# Patient Record
Sex: Female | Born: 1975 | Race: White | Hispanic: No | Marital: Married | Smoking: Former smoker
Health system: Southern US, Community
[De-identification: ages and names within clinical notes are randomized; demographics above are authoritative.]

## PROBLEM LIST (undated history)

## (undated) DIAGNOSIS — Z1371 Encounter for nonprocreative screening for genetic disease carrier status: Secondary | ICD-10-CM

## (undated) DIAGNOSIS — Z8041 Family history of malignant neoplasm of ovary: Secondary | ICD-10-CM

## (undated) DIAGNOSIS — F419 Anxiety disorder, unspecified: Secondary | ICD-10-CM

## (undated) DIAGNOSIS — N3010 Interstitial cystitis (chronic) without hematuria: Secondary | ICD-10-CM

## (undated) DIAGNOSIS — B977 Papillomavirus as the cause of diseases classified elsewhere: Secondary | ICD-10-CM

## (undated) DIAGNOSIS — R232 Flushing: Secondary | ICD-10-CM

## (undated) HISTORY — PX: PARTIAL HYSTERECTOMY: SHX80

## (undated) HISTORY — DX: Family history of malignant neoplasm of ovary: Z80.41

## (undated) HISTORY — DX: Encounter for nonprocreative screening for genetic disease carrier status: Z13.71

## (undated) HISTORY — DX: Papillomavirus as the cause of diseases classified elsewhere: B97.7

---

## 2005-04-17 ENCOUNTER — Ambulatory Visit: Payer: Self-pay | Admitting: Unknown Physician Specialty

## 2012-09-15 DIAGNOSIS — B977 Papillomavirus as the cause of diseases classified elsewhere: Secondary | ICD-10-CM

## 2012-09-15 HISTORY — DX: Papillomavirus as the cause of diseases classified elsewhere: B97.7

## 2015-06-14 DIAGNOSIS — F32A Depression, unspecified: Secondary | ICD-10-CM | POA: Insufficient documentation

## 2015-06-14 DIAGNOSIS — F419 Anxiety disorder, unspecified: Secondary | ICD-10-CM | POA: Insufficient documentation

## 2015-12-09 ENCOUNTER — Ambulatory Visit
Admission: EM | Admit: 2015-12-09 | Discharge: 2015-12-09 | Disposition: A | Discharge status: Home / Self Care | Payer: 59 | Attending: Family Medicine | Admitting: Family Medicine

## 2015-12-09 DIAGNOSIS — J101 Influenza due to other identified influenza virus with other respiratory manifestations: Secondary | ICD-10-CM | POA: Diagnosis not present

## 2015-12-09 HISTORY — DX: Flushing: R23.2

## 2015-12-09 HISTORY — DX: Anxiety disorder, unspecified: F41.9

## 2015-12-09 HISTORY — DX: Interstitial cystitis (chronic) without hematuria: N30.10

## 2015-12-09 LAB — RAPID INFLUENZA A&B ANTIGENS: Influenza A (ARMC): NEGATIVE

## 2015-12-09 LAB — RAPID INFLUENZA A&B ANTIGENS (ARMC ONLY): INFLUENZA B (ARMC): POSITIVE — AB

## 2015-12-09 MED ORDER — OSELTAMIVIR PHOSPHATE 75 MG PO CAPS: 75.0000 mg | ORAL_CAPSULE | Freq: Two times a day (BID) | ORAL | Status: DC

## 2015-12-09 NOTE — ED Notes (Signed)
Patient c/o weakness, body aches, coughing, and fever which all started this past Friday.

## 2015-12-09 NOTE — ED Provider Notes (Signed)
CSN: [id number]     Arrival date & time 12/09/15  0935 History   First MD Initiated Contact with Patient 12/09/15 1110     Chief Complaint  Patient presents with  . URI   (Consider location/radiation/quality/duration/timing/severity/associated sxs/prior Treatment) HPI  40 yo F with onset URI 2 days ago-- fever 101.5 yesterday treated with tylenol- last night fever and chills/ Fatigue , general malaise, mild headache,cough--- reports diagnosed with flu about a month ago -clinical presentation / Not lab confirmed.) Treated with Tamiflu. Now having similar symptoms again.Aching long bones, Has used zyrtec in past for seasonal allergies- not taking regularly  Past Medical History  Diagnosis Date  . Hot flashes   . Anxiety   . Interstitial cystitis    Past Surgical History  Procedure Laterality Date  . Partial hysterectomy     History reviewed. No pertinent family history. Social History  Substance Use Topics  . Smoking status: Never Smoker   . Smokeless tobacco: Never Used  . Alcohol Use: No   OB History    No data available     Review of Systems Review of 10 systems negative for acute change except as referenced in HPI   Allergies  Review of patient's allergies indicates no known allergies.  Home Medications   Prior to Admission medications   Medication Sig Start Date End Date Taking? Authorizing Provider  ALPRAZolam Prudy Feeler) 0.25 MG tablet Take 0.25 mg by mouth at bedtime as needed for anxiety.   Yes Historical Provider, MD  pentosan polysulfate (ELMIRON) 100 MG capsule Take 100 mg by mouth 3 (three) times daily.   Yes Historical Provider, MD  sertraline (ZOLOFT) 50 MG tablet Take 50 mg by mouth daily.   Yes Historical Provider, MD  oseltamivir (TAMIFLU) 75 MG capsule Take 1 capsule (75 mg total) by mouth every 12 (twelve) hours. 12/09/15   Rae Halsted, PA-C   Meds Ordered and Administered this Visit  Medications - No data to display  BP 107/64 mmHg  Pulse 70   Temp(Src) 99 F (37.2 C) (Oral)  Resp 16  Ht  (1.626 m)  Wt 156 lb (70.761 kg)  BMI 26.76 kg/m2  SpO2 99% No data found.   Physical Exam  General: NAD, does not appear toxic but obviously doesn't feel well. HEENT:normocephalic,atraumatic, mucous membranes moist,grossly normal hearing Eyes: EOMI, conjunctiva clear, conjugate gaze Neck: supple,no lymphadenopathy Resp : CT A, bilat; normal respiratory effort Card : RRR Abd:  Not distended Skin: no rash, skin intact MSK: no deformities, ambulatory without assistance Neuro : good attention,recall-good memory, no focal neuro deficits Psych: speech and behavior appropriate   ED Course  Procedures (including critical care time)  Labs Review Labs Reviewed  RAPID INFLUENZA A&B ANTIGENS ([facility] ONLY) - Abnormal; Notable for the following:    Influenza B ([facility]) POSITIVE (*)    All other components within normal limits    Imaging Review No results found.   MDM   1. Influenza B   Plan: Test results and diagnosis reviewed with patient Appropriate for Tamiflu Rx and wishes to proceed Rx as per orders;  benefits, risks, potential side effects reviewed   Recommend supportive treatment with cyclic tylenol and ibuprofen, rest , fluids Relative isolation until 24 hours without fever without medication Seek additional medical care if symptoms worsen or are not improving   New Prescriptions   OSELTAMIVIR (TAMIFLU) 75 MG CAPSULE    Take 1 capsule (75 mg total) by mouth every 12 (twelve) hours.  Rx sent to [facility] but notified they are out of Rx-- Printed to send copy with patient for pharmacy of choice   Rae HalstedLaurie W Matalie Romberger, PA-C 12/12/15 1713

## 2015-12-09 NOTE — Discharge Instructions (Signed)
Increase fluids--- solid food not needed...void every 3 hours...steamy shower/vaporizer- Robitussin DM 1 tsp every 4 hours for cough ane to thin mucous Alternate Tylenol and Ibuprofen  Every 4-6 hours for fever and discomfort   Influenza, Adult Influenza ("the flu") is a viral infection of the respiratory tract. It occurs more often in winter months because people spend more time in close contact with one another. Influenza can make you feel very sick. Influenza easily spreads from person to person (contagious). CAUSES  Influenza is caused by a virus that infects the respiratory tract. You can catch the virus by breathing in droplets from an infected person's cough or sneeze. You can also catch the virus by touching something that was recently contaminated with the virus and then touching your mouth, nose, or eyes. RISKS AND COMPLICATIONS You may be at risk for a more severe case of influenza if you smoke cigarettes, have diabetes, have chronic heart disease (such as heart failure) or lung disease (such as asthma), or if you have a weakened immune system. Elderly people and pregnant women are also at risk for more serious infections. The most common problem of influenza is a lung infection (pneumonia). Sometimes, this problem can require emergency medical care and may be life threatening. SIGNS AND SYMPTOMS  Symptoms typically last 4 to 10 days and may include:  Fever.  Chills.  Headache, body aches, and muscle aches.  Sore throat.  Chest discomfort and cough.  Poor appetite.  Weakness or feeling tired.  Dizziness.  Nausea or vomiting. DIAGNOSIS  Diagnosis of influenza is often made based on your history and a physical exam. A nose or throat swab test can be done to confirm the diagnosis. TREATMENT  In mild cases, influenza goes away on its own. Treatment is directed at relieving symptoms. For more severe cases, your health care provider may prescribe antiviral medicines to shorten  the sickness. Antibiotic medicines are not effective because the infection is caused by a virus, not by bacteria. HOME CARE INSTRUCTIONS  Take medicines only as directed by your health care provider.  Use a cool mist humidifier to make breathing easier.  Get plenty of rest until your temperature returns to normal. This usually takes 3 to 4 days.  Drink enough fluid to keep your urine clear or pale yellow.  Cover yourmouth and nosewhen coughing or sneezing,and wash your handswellto prevent thevirusfrom spreading.  Stay homefromwork orschool untilthe fever is gonefor at least 381full day. PREVENTION  An annual influenza vaccination (flu shot) is the best way to avoid getting influenza. An annual flu shot is now routinely recommended for all adults in the [place] SEEK MEDICAL CARE IF:  You experiencechest pain, yourcough worsens,or you producemore mucus.  Youhave nausea,vomiting, ordiarrhea.  Your fever returns or gets worse. SEEK IMMEDIATE MEDICAL CARE IF:  You havetrouble breathing, you become short of breath,or your skin ornails becomebluish.  You have severe painor stiffnessin the neck.  You develop a sudden headache, or pain in the face or ear.  You have nausea or vomiting that you cannot control. MAKE SURE YOU:   Understand these instructions.  Will watch your condition.  Will get help right away if you are not doing well or get worse.   This information is not intended to replace advice given to you by your health care provider. Make sure you discuss any questions you have with your health care provider.   Document Released: 08/29/2000 Document Revised: 09/22/2014 Document Reviewed: 12/01/2011 Elsevier Interactive Patient Education 2016  Elsevier Inc. ° °

## 2015-12-15 ENCOUNTER — Ambulatory Visit
Admission: EM | Admit: 2015-12-15 | Discharge: 2015-12-15 | Disposition: A | Discharge status: Home / Self Care | Payer: 59 | Attending: Family Medicine | Admitting: Family Medicine

## 2015-12-15 ENCOUNTER — Ambulatory Visit (INDEPENDENT_AMBULATORY_CARE_PROVIDER_SITE_OTHER): Payer: 59

## 2015-12-15 ENCOUNTER — Encounter: Payer: Self-pay | Admitting: Gynecology

## 2015-12-15 DIAGNOSIS — J111 Influenza due to unidentified influenza virus with other respiratory manifestations: Secondary | ICD-10-CM

## 2015-12-15 DIAGNOSIS — J209 Acute bronchitis, unspecified: Secondary | ICD-10-CM | POA: Diagnosis not present

## 2015-12-15 MED ORDER — ALBUTEROL SULFATE HFA 108 (90 BASE) MCG/ACT IN AERS: 2.0000 | INHALATION_SPRAY | RESPIRATORY_TRACT | Status: DC | PRN

## 2015-12-15 MED ORDER — FEXOFENADINE-PSEUDOEPHED ER 180-240 MG PO TB24: 1.0000 | ORAL_TABLET | Freq: Every day | ORAL | Status: DC

## 2015-12-15 MED ORDER — PREDNISONE 10 MG (21) PO TBPK: ORAL_TABLET | ORAL | Status: DC

## 2015-12-15 MED ORDER — AZITHROMYCIN 500 MG PO TABS: ORAL_TABLET | ORAL | Status: DC

## 2015-12-15 MED ORDER — HYDROCOD POLST-CPM POLST ER 10-8 MG/5ML PO SUER: 5.0000 mL | Freq: Two times a day (BID) | ORAL | Status: DC | PRN

## 2015-12-15 NOTE — Discharge Instructions (Signed)
Acute Bronchitis Bronchitis is when the airways that extend from the windpipe into the lungs get red, puffy, and painful (inflamed). Bronchitis often causes thick spit (mucus) to develop. This leads to a cough. A cough is the most common symptom of bronchitis. In acute bronchitis, the condition usually begins suddenly and goes away over time (usually in 2 weeks). Smoking, allergies, and asthma can make bronchitis worse. Repeated episodes of bronchitis may cause more lung problems. HOME CARE  Rest.  Drink enough fluids to keep your pee (urine) clear or pale yellow (unless you need to limit fluids as told by your doctor).  Only take over-the-counter or prescription medicines as told by your doctor.  Avoid smoking and secondhand smoke. These can make bronchitis worse. If you are a smoker, think about using nicotine gum or skin patches. Quitting smoking will help your lungs heal faster.  Reduce the chance of getting bronchitis again by:  Washing your hands often.  Avoiding people with cold symptoms.  Trying not to touch your hands to your mouth, nose, or eyes.  Follow up with your doctor as told. GET HELP IF: Your symptoms do not improve after 1 week of treatment. Symptoms include:  Cough.  Fever.  Coughing up thick spit.  Body aches.  Chest congestion.  Chills.  Shortness of breath.  Sore throat. GET HELP RIGHT AWAY IF:   You have an increased fever.  You have chills.  You have severe shortness of breath.  You have bloody thick spit (sputum).  You throw up (vomit) often.  You lose too much body fluid (dehydration).  You have a severe headache.  You faint. MAKE SURE YOU:   Understand these instructions.  Will watch your condition.  Will get help right away if you are not doing well or get worse.   This information is not intended to replace advice given to you by your health care provider. Make sure you discuss any questions you have with your health care  provider.   Document Released: 02/18/2008 Document Revised: 05/04/2013 Document Reviewed: 02/22/2013 Elsevier Interactive Patient Education 2016 Elsevier Inc.  Pharyngitis Pharyngitis is a sore throat (pharynx). There is redness, pain, and swelling of your throat. HOME CARE   Drink enough fluids to keep your pee (urine) clear or pale yellow.  Only take medicine as told by your doctor.  You may get sick again if you do not take medicine as told. Finish your medicines, even if you start to feel better.  Do not take aspirin.  Rest.  Rinse your mouth (gargle) with salt water ( tsp of salt per 1 qt of water) every 1-2 hours. This will help the pain.  If you are not at risk for choking, you can suck on hard candy or sore throat lozenges. GET HELP IF:  You have large, tender lumps on your neck.  You have a rash.  You cough up green, yellow-brown, or bloody spit. GET HELP RIGHT AWAY IF:   You have a stiff neck.  You drool or cannot swallow liquids.  You throw up (vomit) or are not able to keep medicine or liquids down.  You have very bad pain that does not go away with medicine.  You have problems breathing (not from a stuffy nose). MAKE SURE YOU:   Understand these instructions.  Will watch your condition.  Will get help right away if you are not doing well or get worse.   This information is not intended to replace advice given to  you by your health care provider. Make sure you discuss any questions you have with your health care provider.   Document Released: 02/18/2008 Document Revised: 06/22/2013 Document Reviewed: 05/09/2013 Elsevier Interactive Patient Education 2016 Elsevier Inc.  Upper Respiratory Infection, Adult Most upper respiratory infections (URIs) are caused by a virus. A URI affects the nose, throat, and upper air passages. The most common type of URI is often called "the common cold." HOME CARE   Take medicines only as told by your  doctor.  Gargle warm saltwater or take cough drops to comfort your throat as told by your doctor.  Use a warm mist humidifier or inhale steam from a shower to increase air moisture. This may make it easier to breathe.  Drink enough fluid to keep your pee (urine) clear or pale yellow.  Eat soups and other clear broths.  Have a healthy diet.  Rest as needed.  Go back to work when your fever is gone or your doctor says it is okay.  You may need to stay home longer to avoid giving your URI to others.  You can also wear a face mask and wash your hands often to prevent spread of the virus.  Use your inhaler more if you have asthma.  Do not use any tobacco products, including cigarettes, chewing tobacco, or electronic cigarettes. If you need help quitting, ask your doctor. GET HELP IF:  You are getting worse, not better.  Your symptoms are not helped by medicine.  You have chills.  You are getting more short of breath.  You have brown or red mucus.  You have yellow or brown discharge from your nose.  You have pain in your face, especially when you bend forward.  You have a fever.  You have puffy (swollen) neck glands.  You have pain while swallowing.  You have white areas in the back of your throat. GET HELP RIGHT AWAY IF:   You have very bad or constant:  Headache.  Ear pain.  Pain in your forehead, behind your eyes, and over your cheekbones (sinus pain).  Chest pain.  You have long-lasting (chronic) lung disease and any of the following:  Wheezing.  Long-lasting cough.  Coughing up blood.  A change in your usual mucus.  You have a stiff neck.  You have changes in your:  Vision.  Hearing.  Thinking.  Mood. MAKE SURE YOU:   Understand these instructions.  Will watch your condition.  Will get help right away if you are not doing well or get worse.   This information is not intended to replace advice given to you by your health care  provider. Make sure you discuss any questions you have with your health care provider.   Document Released: 02/18/2008 Document Revised: 01/16/2015 Document Reviewed: 12/07/2013 Elsevier Interactive Patient Education [employer] Elsevier Inc.

## 2015-12-15 NOTE — ED Notes (Signed)
Per patient was seen on 12/09/15 and dx with the FLU. Per patient now with a cough.

## 2015-12-15 NOTE — ED Provider Notes (Signed)
CSN: [id number]     Arrival date & time 12/15/15  1252 History   First MD Initiated Contact with Patient 12/15/15 1339    Nurses notes were reviewed. Chief Complaint  Patient presents with  . Cough   States that she was treated for the flu here on Sunday. Place on Tamiflu fever stopped on Wednesday but she's had a progressive cough chest discomfort and back discomfort as well. She also reports some bronchospasms as well. Of concern is she states she's was coughing even before she had the flu and she been having symptoms of upper respiratory tract cough and congestion for several weeks now. She states that the arm pain and leg pain though has gotten better as well as a fever. She does not smoke does not have been smoking around her.  Medical problems positive for anxiety, interstitial cystitis and previous partial hysterectomy. No significant family medical problems present. She does not smoke.   (Consider location/radiation/quality/duration/timing/severity/associated sxs/prior Treatment) Patient is a 40 y.o. female presenting with cough. The history is provided by the patient. No language interpreter was used.  Cough Cough characteristics:  Productive Sputum characteristics:  Green Severity:  Moderate Onset quality:  Sudden Timing:  Constant Progression:  Worsening Chronicity:  New Relieved by:  Nothing Ineffective treatments:  Cough suppressants Associated symptoms: shortness of breath   Risk factors: recent infection     Past Medical History  Diagnosis Date  . Hot flashes   . Anxiety   . Interstitial cystitis    Past Surgical History  Procedure Laterality Date  . Partial hysterectomy     No family history on file. Social History  Substance Use Topics  . Smoking status: Never Smoker   . Smokeless tobacco: Never Used  . Alcohol Use: No   OB History    Gravida Para Term Preterm AB TAB SAB Ectopic Multiple Living   2 2             Review of Systems  Respiratory:  Positive for cough and shortness of breath.   All other systems reviewed and are negative.   Allergies  Review of patient's allergies indicates no known allergies.  Home Medications   Prior to Admission medications   Medication Sig Start Date End Date Taking? Authorizing Provider  ALPRAZolam Prudy Feeler(XANAX) 0.25 MG tablet Take 0.25 mg by mouth at bedtime as needed for anxiety.   Yes Historical Provider, MD  pentosan polysulfate (ELMIRON) 100 MG capsule Take 100 mg by mouth 3 (three) times daily.   Yes Historical Provider, MD  sertraline (ZOLOFT) 50 MG tablet Take 50 mg by mouth daily.   Yes Historical Provider, MD  albuterol (PROVENTIL HFA;VENTOLIN HFA) 108 (90 Base) MCG/ACT inhaler Inhale 2 puffs into the lungs every 4 (four) hours as needed for wheezing or shortness of breath. 12/15/15   Hassan RowanEugene Lamya Lausch, MD  azithromycin (ZITHROMAX) 500 MG tablet 1 tablet daily for the next 5 days 12/15/15   Hassan RowanEugene Cariah Salatino, MD  chlorpheniramine-HYDROcodone [facility](TUSSIONEX PENNKINETIC ER) 10-8 MG/5ML SUER Take 5 mLs by mouth every 12 (twelve) hours as needed for cough. 12/15/15   Hassan RowanEugene Pavel Gadd, MD  fexofenadine-pseudoephedrine (ALLEGRA-D ALLERGY & CONGESTION) 180-240 MG 24 hr tablet Take 1 tablet by mouth daily. 12/15/15   Hassan RowanEugene Rella Egelston, MD  oseltamivir (TAMIFLU) 75 MG capsule Take 1 capsule (75 mg total) by mouth every 12 (twelve) hours. 12/09/15   Rae HalstedLaurie W Lee, PA-C   Meds Ordered and Administered this Visit  Medications - No data to display  BP  103/64 mmHg  Pulse 68  Temp(Src) 98.2 F (36.8 C) (Oral)  Resp 16  Ht  (1.626 m)  Wt 156 lb (70.761 kg)  BMI 26.76 kg/m2  SpO2 99% No data found.   Physical Exam  Constitutional: She is oriented to person, place, and time. She appears well-developed and well-nourished.  HENT:  Head: Normocephalic and atraumatic.  Eyes: Pupils are equal, round, and reactive to light.  Neck: Normal range of motion.  Cardiovascular: Normal rate and regular rhythm.   Pulmonary/Chest: Effort  normal and breath sounds normal. No respiratory distress. She has no wheezes.  Musculoskeletal: Normal range of motion.  Neurological: She is alert and oriented to person, place, and time.  Skin: Skin is warm and dry.  Vitals reviewed.   ED Course  Procedures (including critical care time)  Labs Review Labs Reviewed - No data to display  Imaging Review Dg Chest 2 View  12/15/2015  CLINICAL DATA:  40 year old female with cough for 1 week. EXAM: CHEST  2 VIEW COMPARISON:  None. FINDINGS: The cardiomediastinal silhouette is unremarkable. There is no evidence of focal airspace disease, pulmonary edema, suspicious pulmonary nodule/mass, pleural effusion, or pneumothorax. No acute bony abnormalities are identified. IMPRESSION: No active cardiopulmonary disease. Electronically Signed   By: Harmon Pier M.D.   On: 12/15/2015 14:15     Visual Acuity Review  Right Eye Distance:   Left Eye Distance:   Bilateral Distance:    Right Eye Near:   Left Eye Near:    Bilateral Near:         MDM   1. Influenzal bronchitis   2. Bronchitis, acute, with bronchospasm      She now has secondary infection from the flu. However the question is whether the secondary infection and a pneumonia or bronchitis. With a history of having difficulty with wheezing and shortness of breath will treat with albuterol inhaler strong consider 6 day course of prednisone and Tussionex 1 teaspoon twice a day for the cough. The big question be the choice of antibiotics and now dependent on chest x-ray. Chest x-ray is negative will go with Zithromax if positive we'll place on Levaquin for pneumonia.   Chest x-ray is negative will place on Zithromax for 5 days.  Note: This dictation was prepared with Dragon dictation along with smaller phrase technology. Any transcriptional errors that result from this process are unintentional.  Hassan Rowan, MD 12/15/15 1453

## 2016-01-08 ENCOUNTER — Other Ambulatory Visit: Payer: Self-pay | Admitting: Physician Assistant

## 2016-01-08 DIAGNOSIS — G43709 Chronic migraine without aura, not intractable, without status migrainosus: Secondary | ICD-10-CM

## 2016-01-25 ENCOUNTER — Ambulatory Visit
Admission: RE | Admit: 2016-01-25 | Discharge: 2016-01-25 | Disposition: A | Discharge status: Home / Self Care | Payer: 59 | Source: Ambulatory Visit | Attending: Physician Assistant | Admitting: Physician Assistant

## 2016-01-25 DIAGNOSIS — G43709 Chronic migraine without aura, not intractable, without status migrainosus: Secondary | ICD-10-CM | POA: Insufficient documentation

## 2016-01-25 MED ORDER — GADOBENATE DIMEGLUMINE 529 MG/ML IV SOLN
15.0000 mL | Freq: Once | INTRAVENOUS | Status: AC | PRN
  Administered 2016-01-25: 14 mL via INTRAVENOUS

## 2016-05-14 IMAGING — MR MR HEAD WO/W CM
9 of 10 series · 40 of 48 positions shown · IV contrast (multihance)
Comparison: None.

CLINICAL DATA: 39-year-old female right side headaches since
[REDACTED]. Dizziness and blurred vision. Chronic migraine. Initial
encounter.

EXAM:
MRI HEAD WITHOUT AND WITH CONTRAST
TECHNIQUE: Multiplanar, multiecho pulse sequences of the brain and surrounding
structures were obtained without and with intravenous contrast.
CONTRAST:  14mL MULTIHANCE GADOBENATE DIMEGLUMINE 529 MG/ML IV SOLN

[Series 2: T1 · sagittal · 5.0mm · 0.45mm/px · 2 of 23 slices shown]
[im 1/23]
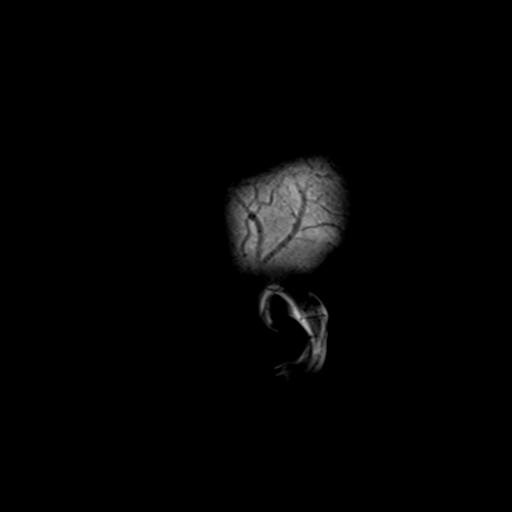
[im 12/23]
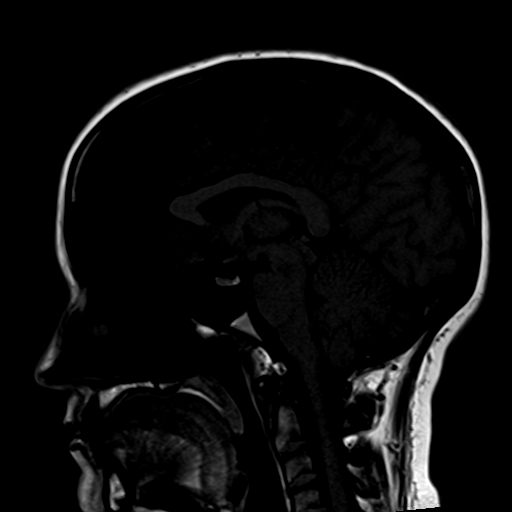

[Series 3: T2 · axial · 5.0mm · 0.72mm/px · z∈[-39,+117]mm · 3 of 25 slices shown (1 of 2)]
[im 1/25]
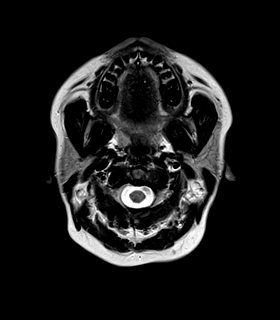
[im 13/25]
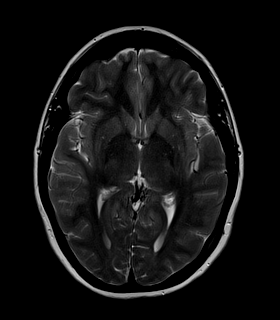
[im 25/25]
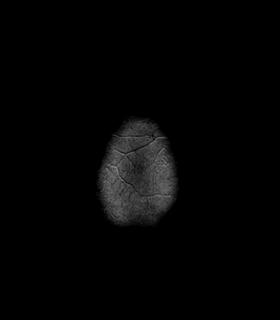

[Series 5: DWI · axial · 3.0mm · 1.20mm/px · z∈[-42,+120]mm · 7 of 55 slices shown (1 of 2)]
[im 1/55]
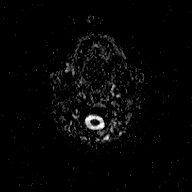
[im 10/55]
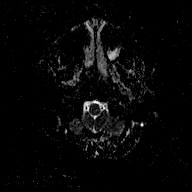
[im 19/55]
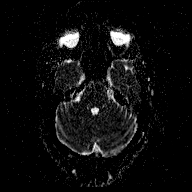
[im 28/55]
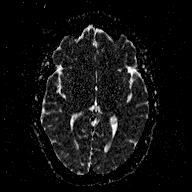
[im 37/55]
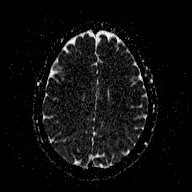
[im 46/55]
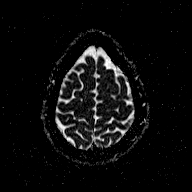
[im 55/55]
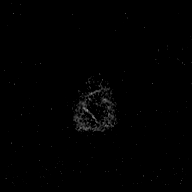

[Series 6: FLAIR · axial · 5.0mm · 0.45mm/px · z∈[-39,+116]mm · 3 of 25 slices shown]
[im 1/25]
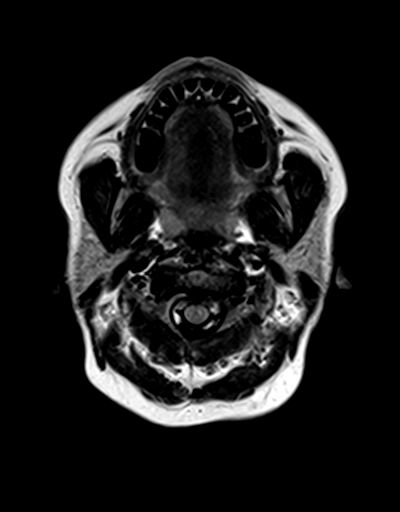
[im 13/25]
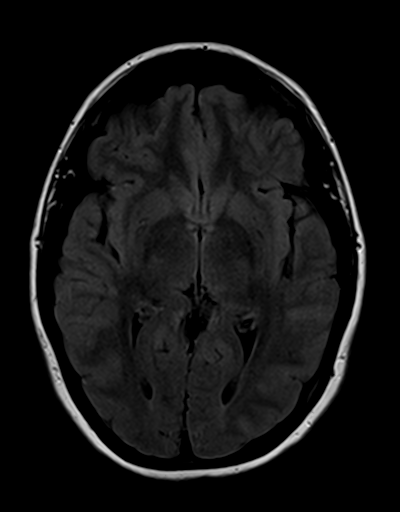
[im 25/25]
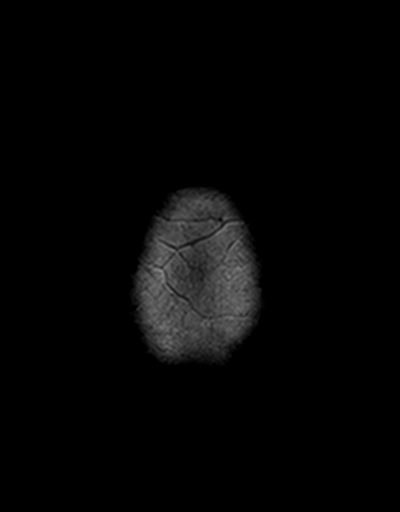

[Series 7: T2 · axial · 5.0mm · 0.72mm/px · z∈[-39,+117]mm · 3 of 25 slices shown (2 of 2)]
[im 1/25]
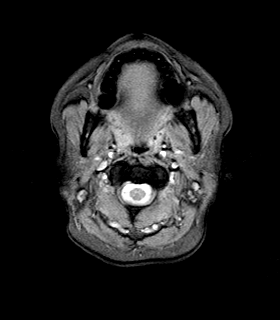
[im 13/25]
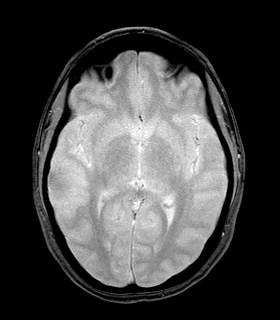
[im 25/25]
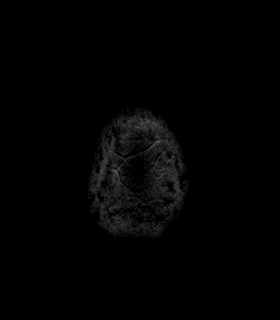

[Series 9: T2 post-contrast · coronal · 5.0mm · 0.45mm/px · 4 of 29 slices shown]
[im 1/29]
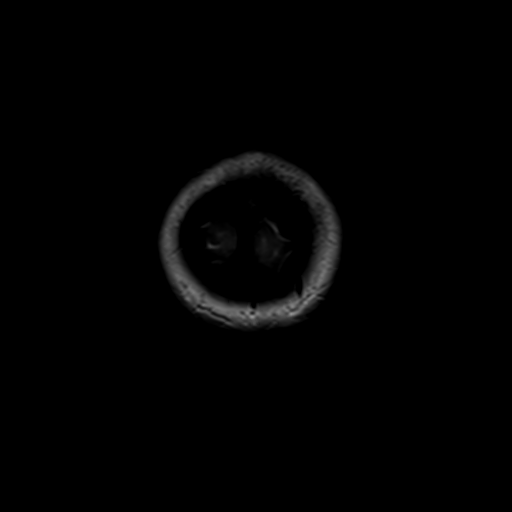
[im 10/29]
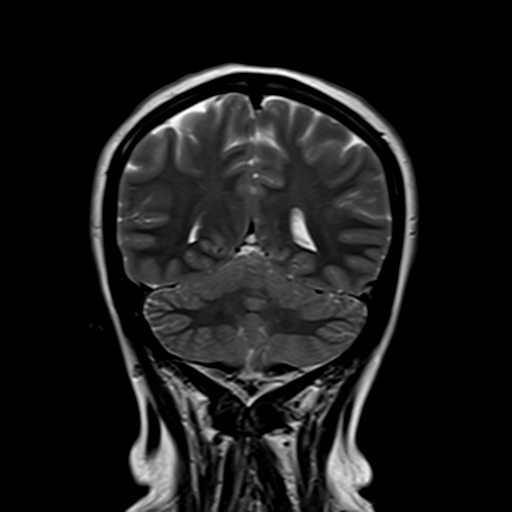
[im 19/29]
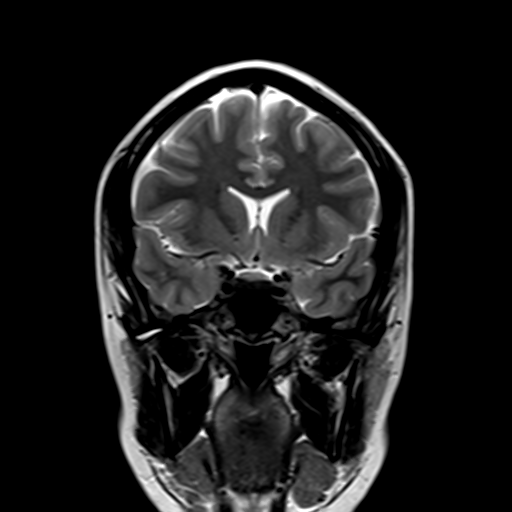
[im 29/29]
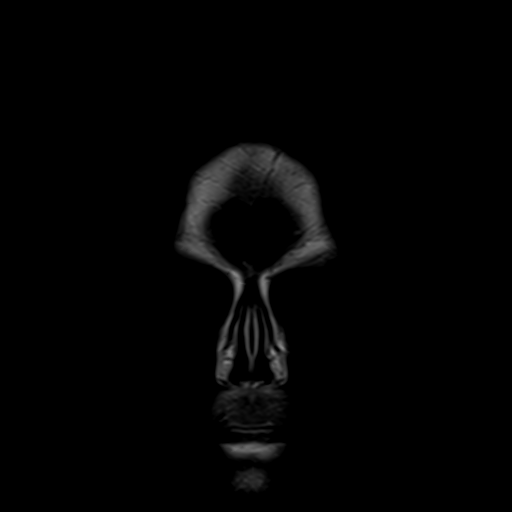

[Series 10: T1 post-contrast · axial · 3.0mm · 1.00mm/px · z∈[-49,+127]mm · 7 of 60 slices shown (1 of 2)]
[im 1/60]
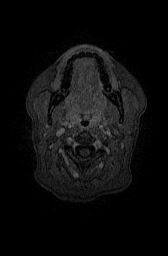
[im 10/60]
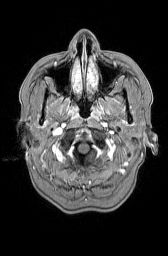
[im 20/60]
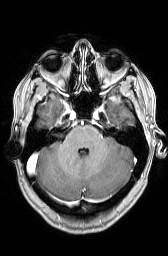
[im 30/60]
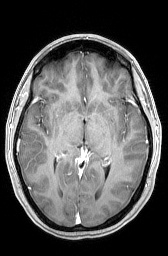
[im 40/60]
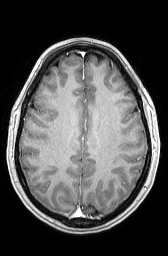
[im 50/60]
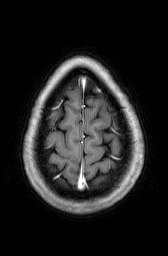
[im 60/60]
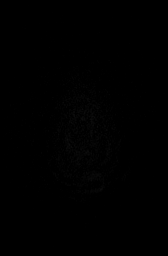

[Series 11: T1 post-contrast · coronal · 5.0mm · 0.45mm/px · 4 of 29 slices shown (2 of 2)]
[im 1/29]
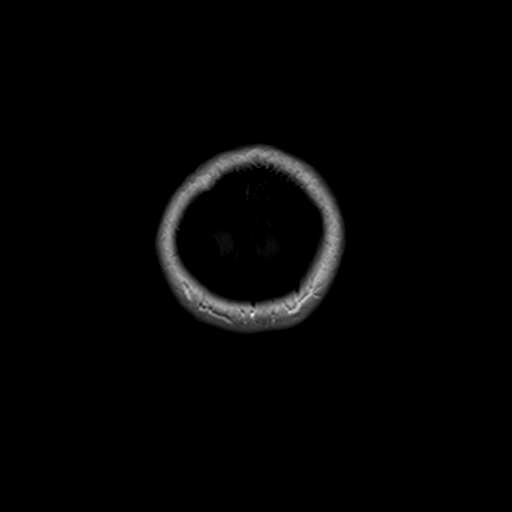
[im 10/29]
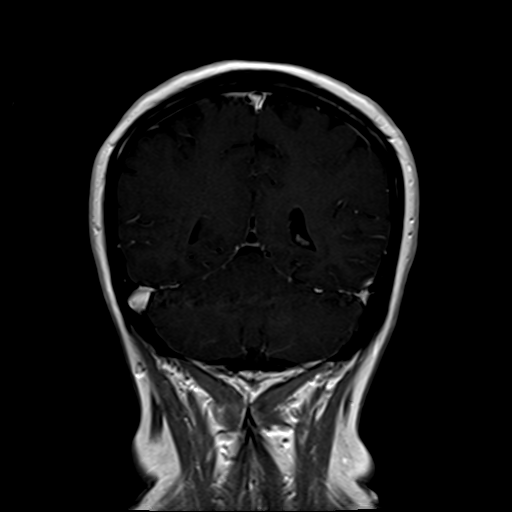
[im 19/29]
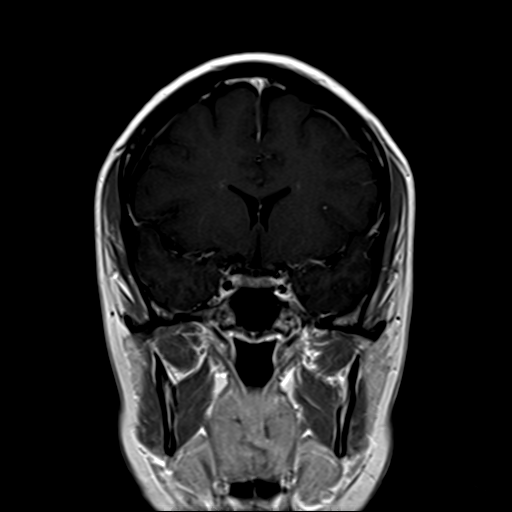
[im 29/29]
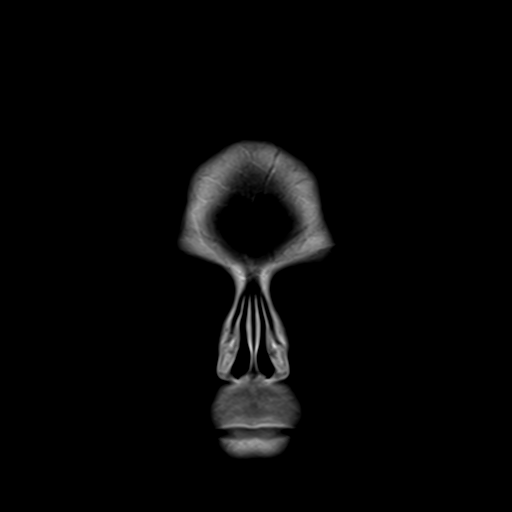

[Series 100: DWI · axial · 3.0mm · 1.20mm/px · z∈[-42,+120]mm · 7 of 55 slices shown (2 of 2)]
[im 1/55]
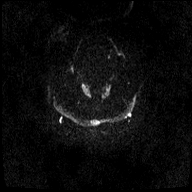
[im 10/55]
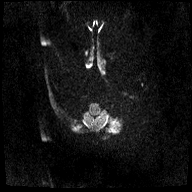
[im 19/55]
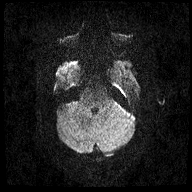
[im 28/55]
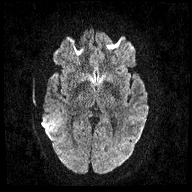
[im 37/55]
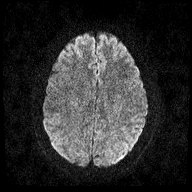
[im 46/55]
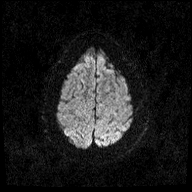
[im 55/55]
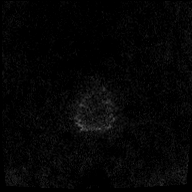

[40 of 48 positions shown; findings below may reference images not displayed]

FINDINGS: Cerebral volume is normal. No restricted diffusion to suggest acute
infarction. No midline shift, mass effect, evidence of mass lesion,
ventriculomegaly, extra-axial collection or acute intracranial
hemorrhage. Cervicomedullary junction and pituitary are within
normal limits. Gray and white matter signal is within normal limits
throughout the brain. No encephalomalacia or chronic cerebral blood
products identified. No abnormal enhancement identified. No dural
thickening. Normal cavernous sinus. Normal optic chiasm.

Negative visualized cervical spine. Visible internal auditory
structures appear normal. Mastoids are clear. Left maxillary sinus
fluid level. Other paranasal sinuses are clear. Negative orbit and
scalp soft tissues. Visualized bone marrow signal is within normal
limits.
IMPRESSION: 1. Normal MRI appearance of the brain.
2. Left maxillary sinus fluid level is nonspecific, but might
reflect maxillary sinusitis in the appropriate clinical setting.
Otherwise no explanation for headache or related symptoms.

## 2016-05-25 ENCOUNTER — Ambulatory Visit
Admission: EM | Admit: 2016-05-25 | Discharge: 2016-05-25 | Disposition: A | Discharge status: Home / Self Care | Payer: 59 | Attending: Family Medicine | Admitting: Family Medicine

## 2016-05-25 ENCOUNTER — Encounter: Payer: Self-pay | Admitting: Gynecology

## 2016-05-25 DIAGNOSIS — B349 Viral infection, unspecified: Secondary | ICD-10-CM

## 2016-05-25 LAB — URINALYSIS COMPLETE WITH MICROSCOPIC (ARMC ONLY)
Bilirubin Urine: NEGATIVE
Glucose, UA: NEGATIVE mg/dL
Hgb urine dipstick: NEGATIVE
KETONES UR: NEGATIVE mg/dL
Leukocytes, UA: NEGATIVE
NITRITE: NEGATIVE
PH: 6.5 (ref 5.0–8.0)
PROTEIN: NEGATIVE mg/dL
SPECIFIC GRAVITY, URINE: 1.02 (ref 1.005–1.030)

## 2016-05-25 LAB — RAPID INFLUENZA A&B ANTIGENS
Influenza A (ARMC): NEGATIVE
Influenza B (ARMC): NEGATIVE

## 2016-05-25 LAB — RAPID STREP SCREEN (MED CTR MEBANE ONLY): STREPTOCOCCUS, GROUP A SCREEN (DIRECT): NEGATIVE

## 2016-05-25 MED ORDER — ONDANSETRON 4 MG PO TBDP: 4.0000 mg | ORAL_TABLET | Freq: Three times a day (TID) | ORAL | 0 refills | Status: DC | PRN

## 2016-05-25 MED ORDER — ONDANSETRON 4 MG PO TBDP
4.0000 mg | ORAL_TABLET | Freq: Once | ORAL | Status: AC
  Administered 2016-05-25: 4 mg via ORAL

## 2016-05-25 NOTE — Discharge Instructions (Signed)
Rest, hydration, if symptoms worsen go to the ER as discussed, BRAT diet suggested once tolerated.

## 2016-05-25 NOTE — ED Provider Notes (Signed)
[facility]    CSN: [id number] Arrival date & time: 05/25/16  0825  First Provider Contact:  None       History   Chief Complaint Chief Complaint  Patient presents with  . Sinusitis  . Nasal Congestion    HPI Kendra West is a 40 y.o. female.   HPI: Patient presents today with symptoms of sore throat, postnasal drip, nausea, chills, subjective fever, diarrhea for the last day. She states her husband does have upper respiratory symptoms. She denies any cough, ear pain, severe headache, abdominal pain, diarrhea. She did take a Tylenol last night when she started to experience chills.  Past Medical History:  Diagnosis Date  . Anxiety   . Hot flashes   . Interstitial cystitis     There are no active problems to display for this patient.   Past Surgical History:  Procedure Laterality Date  . PARTIAL HYSTERECTOMY      OB History    Gravida Para Term Preterm AB Living   2 2           SAB TAB Ectopic Multiple Live Births                   Home Medications    Prior to Admission medications   Medication Sig Start Date End Date Taking? Authorizing Provider  ALPRAZolam Prudy Feeler(XANAX) 0.25 MG tablet Take 0.25 mg by mouth at bedtime as needed for anxiety.   Yes Historical Provider, MD  escitalopram (LEXAPRO) 10 MG tablet Take 10 mg by mouth daily.   Yes Historical Provider, MD  pentosan polysulfate (ELMIRON) 100 MG capsule Take 100 mg by mouth 3 (three) times daily.   Yes Historical Provider, MD  sertraline (ZOLOFT) 50 MG tablet Take 50 mg by mouth daily.   Yes Historical Provider, MD  albuterol (PROVENTIL HFA;VENTOLIN HFA) 108 (90 Base) MCG/ACT inhaler Inhale 2 puffs into the lungs every 4 (four) hours as needed for wheezing or shortness of breath. 12/15/15   Hassan RowanEugene Wade, MD  azithromycin (ZITHROMAX) 500 MG tablet 1 tablet daily for the next 5 days 12/15/15   Hassan RowanEugene Wade, MD  chlorpheniramine-HYDROcodone [facility](TUSSIONEX PENNKINETIC ER) 10-8 MG/5ML SUER Take 5 mLs by mouth  every 12 (twelve) hours as needed for cough. 12/15/15   Hassan RowanEugene Wade, MD  fexofenadine-pseudoephedrine (ALLEGRA-D ALLERGY & CONGESTION) 180-240 MG 24 hr tablet Take 1 tablet by mouth daily. 12/15/15   Hassan RowanEugene Wade, MD  oseltamivir (TAMIFLU) 75 MG capsule Take 1 capsule (75 mg total) by mouth every 12 (twelve) hours. 12/09/15   Rae HalstedLaurie W Lee, PA-C  predniSONE (STERAPRED UNI-PAK 21 TAB) 10 MG (21) TBPK tablet Sig 6 tablet day 1, 5 tablets day 2, 4 tablets day 3,,3tablets day 4, 2 tablets day 5, 1 tablet day 6 take all tablets orally 12/15/15   Hassan RowanEugene Wade, MD    Family History No family history on file.  Social History Social History  Substance Use Topics  . Smoking status: Never Smoker  . Smokeless tobacco: Never Used  . Alcohol use No     Allergies   Review of patient's allergies indicates no known allergies.   Review of Systems Review of Systems: Negative except mentioned above.    Physical Exam Triage Vital Signs ED Triage Vitals  Enc Vitals Group     BP 05/25/16 0844 (!) 103/54     Pulse Rate 05/25/16 0844 100     Resp 05/25/16 0844 16     Temp 05/25/16 0844 98.8 F (  37.1 C)     Temp Source 05/25/16 0844 Oral     SpO2 05/25/16 0844 100 %     Weight 05/25/16 0846 170 lb (77.1 kg)     Height 05/25/16 0846 5\' 5"  (1.651 m)     Head Circumference --      Peak Flow --      Pain Score 05/25/16 0849 9     Pain Loc --      Pain Edu? --      Excl. in GC? --    No data found.   Updated Vital Signs BP (!) 103/54 (BP Location: Left Arm)   Pulse 100   Temp 98.8 F (37.1 C) (Oral)   Resp 16   Ht 5\' 5"  (1.651 m)   Wt 170 lb (77.1 kg)   SpO2 100%   BMI 28.29 kg/m      Physical Exam:  GENERAL: feeling nauseated and spitting mucus HEENT: mild pharyngeal erythema, no exudate, no erythema of TMs, no cervical LAD RESP: CTA B CARD: RRR ABD: +BS, NT NEURO: CN II-XII grossly intact    UC Treatments / Results  Labs (all labs ordered are listed, but only abnormal results are  displayed) Labs Reviewed  RAPID STREP SCREEN (NOT AT [facility])  RAPID INFLUENZA A&B ANTIGENS ([facility] ONLY)    EKG  EKG Interpretation None       Radiology No results found.  Procedures Procedures (including critical care time)  Medications Ordered in UC Medications  ondansetron (ZOFRAN-ODT) disintegrating tablet 4 mg (4 mg Oral Given 05/25/16 0858)     Initial Impression / Assessment and Plan / UC Course  I have reviewed the triage vital signs and the nursing notes.  Pertinent labs & imaging results that were available during my care of the patient were reviewed by me and considered in my medical decision making (see chart for details).  Clinical Course   A/P: Viral Illness - rapid strep test, flu screen were both negative, urinalysis was not suggestive of a UTI, rest, hydration, BRAT diet when tolerated, if symptoms worsen go to the ER as discussed, oral antihistamine when necessary for postnasal drip, Tylenol when necessary, work excuse given for tomorrow, wash hands frequently.  Final Clinical Impressions(s) / UC Diagnoses   Final diagnoses:  None    New Prescriptions New Prescriptions   No medications on file     Jolene Provost, MD 05/25/16 [phone]

## 2016-05-25 NOTE — ED Triage Notes (Signed)
Per patient c/o sinus / chills / body ache and sore throat x last night.

## 2016-05-27 ENCOUNTER — Encounter: Payer: Self-pay | Admitting: *Deleted

## 2016-05-27 ENCOUNTER — Ambulatory Visit
Admission: EM | Admit: 2016-05-27 | Discharge: 2016-05-27 | Disposition: A | Discharge status: Home / Self Care | Payer: 59 | Attending: Family Medicine | Admitting: Family Medicine

## 2016-05-27 DIAGNOSIS — J069 Acute upper respiratory infection, unspecified: Secondary | ICD-10-CM

## 2016-05-27 MED ORDER — CETIRIZINE-PSEUDOEPHEDRINE ER 5-120 MG PO TB12: 1.0000 | ORAL_TABLET | Freq: Two times a day (BID) | ORAL | 0 refills | Status: DC

## 2016-05-27 MED ORDER — HYDROCOD POLST-CPM POLST ER 10-8 MG/5ML PO SUER: 5.0000 mL | Freq: Two times a day (BID) | ORAL | 0 refills | Status: DC

## 2016-05-27 MED ORDER — FLUTICASONE PROPIONATE 50 MCG/ACT NA SUSP: 2.0000 | Freq: Every day | NASAL | 0 refills | Status: DC

## 2016-05-27 MED ORDER — AMOXICILLIN-POT CLAVULANATE 875-125 MG PO TABS: 1.0000 | ORAL_TABLET | Freq: Two times a day (BID) | ORAL | 0 refills | Status: DC

## 2016-05-27 NOTE — ED Triage Notes (Signed)
Patient comes in today with symptoms for a week of Sore throat, nasal congestion, ear pain.Marland Kitchen. She was seen on Sunday [facility] for similar symptoms. No relief of symptoms after taken OTC meds. Syptoms have gotten worse since Sunday. Her neck is bothering her as of this morning which is new.

## 2016-05-27 NOTE — ED Provider Notes (Signed)
CSN: [id number]     Arrival date & time 05/27/16  0900 History   First MD Initiated Contact with Patient 05/27/16 1025     Chief Complaint  Patient presents with  . Cough  . Nasal Congestion  . Neck Pain   (Consider location/radiation/quality/duration/timing/severity/associated sxs/prior Treatment) HPI  This a 40 year old female who presents with week history of sore throat and nasal congestion ear pain. She was seen by Dr. Allena Katz at [facility] for the same symptoms but she's had no relief of the symptoms and states that in fact it has been worsening. She is now unable to sleep at night coughing throughout the night. Cough is productive of a brownish green color. Over-the-counter medications have not been helpful. She has been using Afrin now for 2 days in order to sleep. She is unable to go back to work you know she is thinking of trying today. He is afebrile today but did take some cough and sinus medications earlier. Pulse rate is 62 O2 sat room air is 99%.       Past Medical History:  Diagnosis Date  . Anxiety   . Hot flashes   . Interstitial cystitis    Past Surgical History:  Procedure Laterality Date  . PARTIAL HYSTERECTOMY     History reviewed. No pertinent family history. Social History  Substance Use Topics  . Smoking status: Never Smoker  . Smokeless tobacco: Never Used  . Alcohol use No   OB History    Gravida Para Term Preterm AB Living   2 2           SAB TAB Ectopic Multiple Live Births                 Review of Systems  Constitutional: Positive for activity change. Negative for chills, fatigue and fever.  HENT: Positive for congestion, ear pain, postnasal drip, rhinorrhea, sinus pressure, sneezing and sore throat.   Respiratory: Positive for cough. Negative for wheezing and stridor.   All other systems reviewed and are negative.   Allergies  Review of patient's allergies indicates no known allergies.  Home Medications   Prior to Admission medications    Medication Sig Start Date End Date Taking? Authorizing Provider  ALPRAZolam Prudy Feeler) 0.25 MG tablet Take 0.25 mg by mouth at bedtime as needed for anxiety.   Yes Historical Provider, MD  cetirizine (ZYRTEC) 10 MG tablet Take 10 mg by mouth daily.   Yes Historical Provider, MD  escitalopram (LEXAPRO) 10 MG tablet Take 10 mg by mouth daily.   Yes Historical Provider, MD  ondansetron (ZOFRAN ODT) 4 MG disintegrating tablet Take 1 tablet (4 mg total) by mouth every 8 (eight) hours as needed for nausea or vomiting. 05/25/16  Yes Jolene Provost, MD  oxymetazoline (AFRIN) 0.05 % nasal spray Place 2 sprays into both nostrils 2 (two) times daily.   Yes Historical Provider, MD  pentosan polysulfate (ELMIRON) 100 MG capsule Take 100 mg by mouth 3 (three) times daily.   Yes Historical Provider, MD  pseudoephedrine-acetaminophen (TYLENOL SINUS) 30-500 MG TABS tablet Take 2 tablets by mouth every 4 (four) hours as needed.   Yes Historical Provider, MD  amoxicillin-clavulanate (AUGMENTIN) 875-125 MG tablet Take 1 tablet by mouth every 12 (twelve) hours. 05/27/16   Lutricia Feil, PA-C  cetirizine-pseudoephedrine (ZYRTEC-D) 5-120 MG tablet Take 1 tablet by mouth 2 (two) times daily. 05/27/16   Lutricia Feil, PA-C  chlorpheniramine-HYDROcodone (TUSSIONEX PENNKINETIC ER) 10-8 MG/5ML SUER Take 5 mLs by mouth  2 (two) times daily. 05/27/16   Lutricia Feil, PA-C  fluticasone (FLONASE) 50 MCG/ACT nasal spray Place 2 sprays into both nostrils daily. 05/27/16   Lutricia Feil, PA-C  sertraline (ZOLOFT) 50 MG tablet Take 50 mg by mouth daily.    Historical Provider, MD   Meds Ordered and Administered this Visit  Medications - No data to display  BP 112/63 (BP Location: Left Arm)   Pulse 62   Temp 97.8 F (36.6 C) (Oral)   Resp 14   Ht 5\' 5"  (1.651 m)   Wt 170 lb (77.1 kg)   SpO2 99%   BMI 28.29 kg/m  No data found.   Physical Exam  Constitutional: She is oriented to person, place, and time. She appears  well-developed and well-nourished. No distress.  HENT:  Head: Normocephalic and atraumatic.  Right Ear: External ear normal.  Left Ear: External ear normal.  Nose: Nose normal.  Mouth/Throat: Oropharynx is clear and moist.  Eyes: EOM are normal. Pupils are equal, round, and reactive to light. Right eye exhibits no discharge. Left eye exhibits no discharge.  Neck: Normal range of motion. Neck supple.  Pulmonary/Chest: Effort normal and breath sounds normal. No respiratory distress. She has no wheezes. She has no rales.  Musculoskeletal: Normal range of motion. She exhibits no edema or deformity.  Lymphadenopathy:    She has no cervical adenopathy.  Neurological: She is alert and oriented to person, place, and time.  Skin: Skin is warm and dry. She is not diaphoretic.  Psychiatric: She has a normal mood and affect. Her behavior is normal. Judgment and thought content normal.  Nursing note and vitals reviewed.   Urgent Care Course   Clinical Course    Procedures (including critical care time)  Labs Review Labs Reviewed - No data to display  Imaging Review No results found.   Visual Acuity Review  Right Eye Distance:   Left Eye Distance:   Bilateral Distance:    Right Eye Near:   Left Eye Near:    Bilateral Near:         MDM   1. URI (upper respiratory infection)    Discharge Medication List as of 05/27/2016 10:47 AM    START taking these medications   Details  amoxicillin-clavulanate (AUGMENTIN) 875-125 MG tablet Take 1 tablet by mouth every 12 (twelve) hours., Starting Tue 05/27/2016, Normal    cetirizine-pseudoephedrine (ZYRTEC-D) 5-120 MG tablet Take 1 tablet by mouth 2 (two) times daily., Starting Tue 05/27/2016, Normal    chlorpheniramine-HYDROcodone (TUSSIONEX PENNKINETIC ER) 10-8 MG/5ML SUER Take 5 mLs by mouth 2 (two) times daily., Starting Tue 05/27/2016, Print    fluticasone (FLONASE) 50 MCG/ACT nasal spray Place 2 sprays into both nostrils daily.,  Starting Tue 05/27/2016, Normal      Plan: 1. Test/x-ray results and diagnosis reviewed with patient 2. rx as per orders; risks, benefits, potential side effects reviewed with patient 3. Recommend supportive treatment with Stopping the Afrin and begin using the Flonase. I told her this is likely a viral illness but since she is worsening it is worth a try to give antibiotics see if that will help. She is coughing continuously through the examination sounds wet although her lung exam is normal. I will look give her some Tussionex to allow her to sleep more comfortably at nighttime.  I will keep her out of Work today and tomorrow in order not to infect her coworkers. When she does return recommend that she use a mask.  She is not improving follow-up with her primary care physician. 4. F/u prn if symptoms worsen or don't improve     Lutricia FeilWilliam P Printice Hellmer, PA-C 05/27/16 [address]    Jermia Rigsby P Winding CypressRoemer, [place] JerseyPA-C 05/27/16 1905

## 2016-05-28 LAB — CULTURE, GROUP A STREP (THRC)

## 2017-08-13 ENCOUNTER — Other Ambulatory Visit: Payer: Self-pay

## 2017-08-13 ENCOUNTER — Encounter: Payer: Self-pay | Admitting: Obstetrics and Gynecology

## 2017-08-13 ENCOUNTER — Ambulatory Visit (INDEPENDENT_AMBULATORY_CARE_PROVIDER_SITE_OTHER): Payer: 59 | Admitting: Obstetrics and Gynecology

## 2017-08-13 VITALS — BP 108/70 | HR 80 | Ht 64.75 in | Wt 180.0 lb

## 2017-08-13 DIAGNOSIS — Z8041 Family history of malignant neoplasm of ovary: Secondary | ICD-10-CM

## 2017-08-13 DIAGNOSIS — Z1151 Encounter for screening for human papillomavirus (HPV): Secondary | ICD-10-CM | POA: Diagnosis not present

## 2017-08-13 DIAGNOSIS — Z01419 Encounter for gynecological examination (general) (routine) without abnormal findings: Secondary | ICD-10-CM | POA: Diagnosis not present

## 2017-08-13 DIAGNOSIS — Z1231 Encounter for screening mammogram for malignant neoplasm of breast: Secondary | ICD-10-CM | POA: Diagnosis not present

## 2017-08-13 DIAGNOSIS — Z1239 Encounter for other screening for malignant neoplasm of breast: Secondary | ICD-10-CM

## 2017-08-13 DIAGNOSIS — Z124 Encounter for screening for malignant neoplasm of cervix: Secondary | ICD-10-CM

## 2017-08-13 NOTE — Progress Notes (Signed)
PCP:  Marguarite ArbourSparks, Jeffrey D, MD   Chief Complaint  Patient presents with  . Gynecologic Exam    No Complaints     HPI:      Ms. Kendra West is a 41 y.o. G2P2 who LMP was No LMP recorded. Patient has had a hysterectomy., presents today for her NP annual examination.  Her menses are absent due to lap supracx hyst for endometriosis, DUB, ovar cysts. Dysmenorrhea none. She does not have intermenstrual bleeding. She has vasomotor sx and is taking a medication nightly through her PCP with sx relief (pt can't remember name).  Sex activity: single partner, contraception - hysterectomy. Last Pap: May 05, 2013  Results were: no abnormalities /POS HPV DNA. Never had repeat pap. Due today. Hx of STDs: HPV on pap.  There is no FH of breast cancer. There is a FH of ovarian cancer in her MGM who also had colon cancer. Genetic testing not done. The patient does do self-breast exams.  Tobacco use: The patient denies current or previous tobacco use. Alcohol use: none No drug use.  Exercise: moderately active  She does get adequate calcium and Vitamin D in her diet.  Labs with PCP.     Past Medical History:  Diagnosis Date  . Anxiety   . Hot flashes   . HPV (human papilloma virus) infection 2014   Pap Smear  . Interstitial cystitis     Past Surgical History:  Procedure Laterality Date  . PARTIAL HYSTERECTOMY      Family History  Problem Relation Age of Onset  . Ovarian cancer Maternal Grandmother 50  . Colon cancer Maternal Grandmother 60  . Brain cancer Maternal Grandmother 170  . Lung cancer Paternal Grandmother 960  . Diabetes Paternal Grandmother     Social History   Socioeconomic History  . Marital status: Married    Spouse name: Not on file  . Number of children: Not on file  . Years of education: Not on file  . Highest education level: Not on file  Social Needs  . Financial resource strain: Not on file  . Food insecurity - worry: Not on file  . Food  insecurity - inability: Not on file  . Transportation needs - medical: Not on file  . Transportation needs - non-medical: Not on file  Occupational History  . Not on file  Tobacco Use  . Smoking status: Never Smoker  . Smokeless tobacco: Never Used  Substance and Sexual Activity  . Alcohol use: No  . Drug use: No  . Sexual activity: Yes  Other Topics Concern  . Not on file  Social History Narrative  . Not on file    Current Meds  Medication Sig  . ALPRAZolam (XANAX) 0.25 MG tablet Take 0.25 mg by mouth at bedtime as needed for anxiety.  . cetirizine (ZYRTEC) 10 MG tablet Take 10 mg by mouth daily.  . pentosan polysulfate (ELMIRON) 100 MG capsule Take 100 mg by mouth 3 (three) times daily.  . rizatriptan (MAXALT) 10 MG tablet Take 10 mg by mouth as needed for migraine. May repeat in 2 hours if needed  . SUMAtriptan (IMITREX) 100 MG tablet Take 100 mg by mouth every 2 (two) hours as needed for migraine. May repeat in 2 hours if headache persists or recurs.     ROS:  Review of Systems  Constitutional: Negative for fatigue, fever and unexpected weight change.  Respiratory: Negative for cough, shortness of breath and wheezing.   Cardiovascular: Negative  for chest pain, palpitations and leg swelling.  Gastrointestinal: Negative for blood in stool, constipation, diarrhea, nausea and vomiting.  Endocrine: Negative for cold intolerance, heat intolerance and polyuria.  Genitourinary: Negative for dyspareunia, dysuria, flank pain, frequency, genital sores, hematuria, menstrual problem, pelvic pain, urgency, vaginal bleeding, vaginal discharge and vaginal pain.  Musculoskeletal: Negative for back pain, joint swelling and myalgias.  Skin: Negative for rash.  Neurological: Negative for dizziness, syncope, light-headedness, numbness and headaches.  Hematological: Negative for adenopathy.  Psychiatric/Behavioral: Negative for agitation, confusion, sleep disturbance and suicidal ideas. The  patient is not nervous/anxious.      Objective: BP 108/70 (BP Location: Left Arm, Patient Position: Sitting, Cuff Size: Normal)   Pulse 80   Ht 5' 4.75" (1.645 m)   Wt 180 lb (81.6 kg)   BMI 30.19 kg/m    Physical Exam  Constitutional: She is oriented to person, place, and time. She appears well-developed and well-nourished.  Genitourinary: Vagina normal. There is no rash or tenderness on the right labia. There is no rash or tenderness on the left labia. No erythema or tenderness in the vagina. No vaginal discharge found. Right adnexum does not display mass and does not display tenderness. Left adnexum does not display mass and does not display tenderness. Cervix does not exhibit motion tenderness or polyp.  Genitourinary Comments: UTERUS  SURG REM  Neck: Normal range of motion. No thyromegaly present.  Cardiovascular: Normal rate, regular rhythm and normal heart sounds.  No murmur heard. Pulmonary/Chest: Effort normal and breath sounds normal. Right breast exhibits no mass, no nipple discharge, no skin change and no tenderness. Left breast exhibits no mass, no nipple discharge, no skin change and no tenderness.  Abdominal: Soft. There is no tenderness. There is no guarding.  Musculoskeletal: Normal range of motion.  Neurological: She is alert and oriented to person, place, and time. No cranial nerve deficit.  Psychiatric: She has a normal mood and affect. Her behavior is normal.  Vitals reviewed.   Assessment/Plan: Encounter for annual routine gynecological examination  Cervical cancer screening - Plan: IGP, Aptima HPV  Screening for HPV (human papillomavirus) - Plan: IGP, Aptima HPV  Screening for breast cancer - Pt to sched mammo.  - Plan: MM DIGITAL SCREENING BILATERAL  Family history of ovarian cancer - MyRisk testing discussed and done today. Will call pt wiht results.  GYN counsel mammography screening, menopause, adequate intake of calcium and vitamin D, diet and  exercise     F/U  Return in about 1 year (around 08/13/2018).  Alicia B. Copland, PA-C 08/13/2017 11:20 AM

## 2017-08-13 NOTE — Patient Instructions (Signed)
I value your feedback and entrusting us with your care. If you get a [facility] patient survey, I would appreciate you taking the time to let us know about your experience today. Thank you! 

## 2017-08-15 DIAGNOSIS — Z1371 Encounter for nonprocreative screening for genetic disease carrier status: Secondary | ICD-10-CM

## 2017-08-15 HISTORY — DX: Encounter for nonprocreative screening for genetic disease carrier status: Z13.71

## 2017-08-15 LAB — IGP, APTIMA HPV
HPV Aptima: NEGATIVE
PAP Smear Comment: 0

## 2017-09-03 ENCOUNTER — Encounter: Payer: Self-pay | Admitting: Obstetrics and Gynecology

## 2017-09-14 ENCOUNTER — Telehealth: Payer: Self-pay | Admitting: Obstetrics and Gynecology

## 2017-09-14 NOTE — Telephone Encounter (Signed)
[facility]

## 2017-09-16 NOTE — Telephone Encounter (Signed)
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 Cont routine screening recommendations. Hard copy mailed to pt. Questions answered.   Patient understands these results only apply to her and her children, and this is not indicative of genetic testing results of her other family members. It is recommended that her other family members have genetic testing done.  Pt also understands negative genetic testing doesn't mean she will never get any of these cancers.

## 2017-09-16 NOTE — Telephone Encounter (Signed)
Pt is calling back about her labs results. Please advise °

## 2017-11-01 ENCOUNTER — Ambulatory Visit
Admission: EM | Admit: 2017-11-01 | Discharge: 2017-11-01 | Disposition: A | Discharge status: Home / Self Care | Payer: Managed Care, Other (non HMO) | Attending: Emergency Medicine | Admitting: Emergency Medicine

## 2017-11-01 ENCOUNTER — Other Ambulatory Visit: Payer: Self-pay

## 2017-11-01 ENCOUNTER — Encounter: Payer: Self-pay | Admitting: *Deleted

## 2017-11-01 DIAGNOSIS — R110 Nausea: Secondary | ICD-10-CM | POA: Diagnosis not present

## 2017-11-01 DIAGNOSIS — R197 Diarrhea, unspecified: Secondary | ICD-10-CM | POA: Diagnosis not present

## 2017-11-01 MED ORDER — ONDANSETRON HCL 4 MG PO TABS: 4.0000 mg | ORAL_TABLET | Freq: Three times a day (TID) | ORAL | 0 refills | Status: AC | PRN

## 2017-11-01 NOTE — ED Provider Notes (Signed)
[facility]    CSN: [id number] Arrival date & time: 11/01/17  0801     History   Chief Complaint Chief Complaint  Patient presents with  . Nausea  . Diarrhea    HPI Kendra West is a 42 y.o. female.   Pt states she ahs had body aches x 5 days, now has nausea, diarrhea since last night. Several employees recently dx w flu. After eating feels queasy.    The history is provided by the patient. No language interpreter was used.  Diarrhea  Diarrhea characteristics: loose. Severity:  Mild Onset quality:  Sudden Duration:  1 day Timing:  Sporadic Progression:  Unchanged Relieved by:  Nothing Worsened by:  Nothing Ineffective treatments:  None tried Associated symptoms: no abdominal pain, no chills, no fever and no vomiting   Risk factors: sick contacts     Past Medical History:  Diagnosis Date  . Anxiety   . BRCA negative 08/2017   MyRIsk neg except MLH1 VUS; IBIS=5.6%  . Family history of ovarian cancer   . Hot flashes   . HPV (human papilloma virus) infection 2014   Pap Smear  . Interstitial cystitis     Patient Active Problem List   Diagnosis Date Noted  . Diarrhea 11/01/2017  . Nausea 11/01/2017  . Family history of ovarian cancer 08/13/2017    Past Surgical History:  Procedure Laterality Date  . PARTIAL HYSTERECTOMY      OB History    Gravida Para Term Preterm AB Living   2 2           SAB TAB Ectopic Multiple Live Births                   Home Medications    Prior to Admission medications   Medication Sig Start Date End Date Taking? Authorizing Provider  ALPRAZolam Duanne Moron) 0.25 MG tablet Take 0.25 mg by mouth at bedtime as needed for anxiety.   Yes [provider]  cetirizine (ZYRTEC) 10 MG tablet Take 10 mg by mouth daily.   Yes [provider]  fluticasone (FLONASE) 50 MCG/ACT nasal spray Place 2 sprays into both nostrils daily. 05/27/16  Yes Lorin Picket, PA-C  pentosan polysulfate (ELMIRON) 100 MG  capsule Take 100 mg by mouth 3 (three) times daily.   Yes [provider]  rizatriptan (MAXALT) 10 MG tablet Take 10 mg by mouth as needed for migraine. May repeat in 2 hours if needed   Yes [provider]  SUMAtriptan (IMITREX) 100 MG tablet Take 100 mg by mouth every 2 (two) hours as needed for migraine. May repeat in 2 hours if headache persists or recurs.   Yes [provider]  amoxicillin-clavulanate (AUGMENTIN) 875-125 MG tablet Take 1 tablet by mouth every 12 (twelve) hours. Patient not taking: Reported on 08/13/2017 05/27/16   Lorin Picket, PA-C  cetirizine-pseudoephedrine (ZYRTEC-D) 5-120 MG tablet Take 1 tablet by mouth 2 (two) times daily. Patient not taking: Reported on 08/13/2017 05/27/16   Lorin Picket, PA-C  chlorpheniramine-HYDROcodone [facility] ER) 10-8 MG/5ML SUER Take 5 mLs by mouth 2 (two) times daily. Patient not taking: Reported on 08/13/2017 05/27/16   Crecencio Mc P, PA-C  escitalopram (LEXAPRO) 10 MG tablet Take 10 mg by mouth daily.    [provider]  ondansetron (ZOFRAN ODT) 4 MG disintegrating tablet Take 1 tablet (4 mg total) by mouth every 8 (eight) hours as needed for nausea or vomiting. Patient not taking: Reported  on 08/13/2017 05/25/16   Paulina Fusi, MD  ondansetron (ZOFRAN) 4 MG tablet Take 1 tablet (4 mg total) by mouth every 8 (eight) hours as needed for up to 3 days for nausea or vomiting. 1/60/10 9/32/35  Leeman Johnsey, Jeanett Schlein, NP  oxymetazoline (AFRIN) 0.05 % nasal spray Place 2 sprays into both nostrils 2 (two) times daily.    [provider]  pseudoephedrine-acetaminophen (TYLENOL SINUS) 30-500 MG TABS tablet Take 2 tablets by mouth every 4 (four) hours as needed.    [provider]  sertraline (ZOLOFT) 50 MG tablet Take 50 mg by mouth daily.    [provider]    Family History Family History  Problem Relation Age of Onset  . Ovarian cancer Maternal Grandmother 20  .  Colon cancer Maternal Grandmother 58  . Brain cancer Maternal Grandmother 5  . Lung cancer Paternal Grandmother 64  . Diabetes Paternal Grandmother     Social History Social History   Tobacco Use  . Smoking status: Never Smoker  . Smokeless tobacco: Never Used  Substance Use Topics  . Alcohol use: No  . Drug use: No     Allergies   Patient has no known allergies.   Review of Systems Review of Systems  Constitutional: Positive for appetite change. Negative for chills and fever.  HENT: Negative.   Respiratory: Negative for shortness of breath.   Cardiovascular: Negative for chest pain.  Gastrointestinal: Positive for diarrhea and nausea. Negative for abdominal pain, constipation and vomiting.  Endocrine: Negative.   Genitourinary: Negative for dysuria, hematuria, vaginal bleeding and vaginal discharge.  Skin: Negative for rash.  Allergic/Immunologic: Negative.   Neurological: Negative.   Hematological: Negative.   Psychiatric/Behavioral: Negative.   All other systems reviewed and are negative.    Physical Exam Triage Vital Signs ED Triage Vitals  Enc Vitals Group     BP      Pulse      Resp      Temp      Temp src      SpO2      Weight      Height      Head Circumference      Peak Flow      Pain Score      Pain Loc      Pain Edu?      Excl. in [place]?    No data found.  Updated Vital Signs BP 112/61 (BP Location: Left Arm)   Pulse 66   Temp 98 F (36.7 C) (Oral)   Resp 16   Ht 5' 4"  (1.626 m)   Wt 180 lb (81.6 kg)   SpO2 97%   BMI 30.90 kg/m   Visual Acuity Right Eye Distance:   Left Eye Distance:   Bilateral Distance:    Right Eye Near:   Left Eye Near:    Bilateral Near:     Physical Exam  Constitutional: Vital signs are normal. She appears well-developed and well-nourished. She is active and cooperative.  Non-toxic appearance. She does not have a sickly appearance. She does not appear ill. No distress.  HENT:  Head: Normocephalic.    Right Ear: Tympanic membrane normal.  Left Ear: Tympanic membrane normal.  Nose: Nose normal.  Mouth/Throat: Uvula is midline and mucous membranes are normal. Posterior oropharyngeal erythema present. No oropharyngeal exudate, posterior oropharyngeal edema or tonsillar abscesses.  Eyes: Conjunctivae, EOM and lids are normal. Pupils are equal, round, and reactive to light.  Neck: Trachea normal and  normal range of motion.  Cardiovascular: Normal rate, regular rhythm, normal heart sounds and normal pulses.  Pulmonary/Chest: Effort normal and breath sounds normal.  Abdominal: Soft. Normal appearance. Bowel sounds are increased. There is no tenderness.  Neurological: She is alert. No cranial nerve deficit or sensory deficit. GCS eye subscore is 4. GCS verbal subscore is 5. GCS motor subscore is 6.  Skin: Skin is warm, dry and intact.  Psychiatric: She has a normal mood and affect. Her speech is normal and behavior is normal.  Nursing note and vitals reviewed.    UC Treatments / Results  Labs (all labs ordered are listed, but only abnormal results are displayed) Labs Reviewed - No data to display  EKG  EKG Interpretation None       Radiology No results found.  Procedures Procedures (including critical care time)  Medications Ordered in UC Medications - No data to display   Initial Impression / Assessment and Plan / UC Course  I have reviewed the triage vital signs and the nursing notes.  Pertinent labs & imaging results that were available during my care of the patient were reviewed by me and considered in my medical decision making (see chart for details).    Rest,push fluids, take zofran as directed. Most likely this is viral in nature. ABX will not help with viral illness. Follow up with PCP, [facility] in 2 days if no improvement, sooner if worse. Pt verbalized understanding to this provider.   Final Clinical Impressions(s) / UC Diagnoses   Final diagnoses:  Diarrhea,  unspecified type  Nausea    ED Discharge Orders        Ordered    ondansetron (ZOFRAN) 4 MG tablet  Every 8 hours PRN     11/01/17 0834       Controlled Substance Prescriptions    Cassey Hurrell, Jeanett Schlein, NP 83/38/25 1706

## 2017-11-01 NOTE — ED Triage Notes (Signed)
Patient has had symptoms of body aches for 5 days. Last PM symptoms of nausea and diarrhea started.

## 2017-11-01 NOTE — Discharge Instructions (Signed)
Rest,push fluids, take zofran as directed. Most likely this is viral in nature. ABX will not help with viral illness. Follow up with PCP, [facility] in 2 days if no improvement, sooner if worse.

## 2017-11-19 ENCOUNTER — Other Ambulatory Visit: Payer: Self-pay

## 2017-11-19 ENCOUNTER — Ambulatory Visit
Admission: EM | Admit: 2017-11-19 | Discharge: 2017-11-19 | Disposition: A | Discharge status: Home / Self Care | Payer: Managed Care, Other (non HMO) | Attending: Family Medicine | Admitting: Family Medicine

## 2017-11-19 DIAGNOSIS — R05 Cough: Secondary | ICD-10-CM

## 2017-11-19 DIAGNOSIS — H9202 Otalgia, left ear: Secondary | ICD-10-CM

## 2017-11-19 DIAGNOSIS — J029 Acute pharyngitis, unspecified: Secondary | ICD-10-CM | POA: Diagnosis not present

## 2017-11-19 DIAGNOSIS — R69 Illness, unspecified: Secondary | ICD-10-CM

## 2017-11-19 DIAGNOSIS — J111 Influenza due to unidentified influenza virus with other respiratory manifestations: Secondary | ICD-10-CM

## 2017-11-19 MED ORDER — HYDROCOD POLST-CPM POLST ER 10-8 MG/5ML PO SUER: 5.0000 mL | Freq: Two times a day (BID) | ORAL | 0 refills | Status: DC | PRN

## 2017-11-19 NOTE — ED Triage Notes (Signed)
Patient complains of cough, body aches, congestion and left ear discomfort. Patient states that symptoms have been constant since Monday.

## 2017-11-19 NOTE — Discharge Instructions (Signed)
Rest, fluids.  Continue flonase and zyrtec.  Tussionex for cough.  I hope you feel better soon.  Take care  Dr. Adriana Simasook

## 2017-11-19 NOTE — ED Provider Notes (Signed)
[facility]    CSN: [id number] Arrival date & time: 11/19/17  0815  History   Chief Complaint Chief Complaint  Patient presents with  . Cough   HPI  42 year old female presents with cough, body aches, congestion.  Symptoms started on Monday.  She reports dry cough.  Associated body aches, congestion, left ear pain.  She states that she is tried over-the-counter Mucinex, Zyrtec without improvement.  No fever.  No known exacerbating factors.  No known relieving factors.  No reported sick contacts.  No other associated symptoms.  No other complaints.  Past Medical History:  Diagnosis Date  . Anxiety   . BRCA negative 08/2017   MyRIsk neg except MLH1 VUS; IBIS=5.6%  . Family history of ovarian cancer   . Hot flashes   . HPV (human papilloma virus) infection 2014   Pap Smear  . Interstitial cystitis     Patient Active Problem List   Diagnosis Date Noted  . Diarrhea 11/01/2017  . Nausea 11/01/2017  . Family history of ovarian cancer 08/13/2017    Past Surgical History:  Procedure Laterality Date  . PARTIAL HYSTERECTOMY      OB History    Gravida Para Term Preterm AB Living   2 2           SAB TAB Ectopic Multiple Live Births                 Home Medications    Prior to Admission medications   Medication Sig Start Date End Date Taking? Authorizing Provider  ALPRAZolam Duanne Moron) 0.25 MG tablet Take 0.25 mg by mouth at bedtime as needed for anxiety.   Yes [provider]  cetirizine-pseudoephedrine (ZYRTEC-D) 5-120 MG tablet Take 1 tablet by mouth 2 (two) times daily. 05/27/16  Yes Lorin Picket, PA-C  escitalopram (LEXAPRO) 10 MG tablet Take 10 mg by mouth daily.   Yes [provider]  fluticasone (FLONASE) 50 MCG/ACT nasal spray Place 2 sprays into both nostrils daily. 05/27/16  Yes Lorin Picket, PA-C  pentosan polysulfate (ELMIRON) 100 MG capsule Take 100 mg by mouth 3 (three) times daily.   Yes [provider]    rizatriptan (MAXALT) 10 MG tablet Take 10 mg by mouth as needed for migraine. May repeat in 2 hours if needed   Yes [provider]  SUMAtriptan (IMITREX) 100 MG tablet Take 100 mg by mouth every 2 (two) hours as needed for migraine. May repeat in 2 hours if headache persists or recurs.   Yes [provider]  chlorpheniramine-HYDROcodone (TUSSIONEX PENNKINETIC ER) 10-8 MG/5ML SUER Take 5 mLs by mouth every 12 (twelve) hours as needed. 11/19/17   Coral Spikes, DO    Family History Family History  Problem Relation Age of Onset  . Ovarian cancer Maternal Grandmother 71  . Colon cancer Maternal Grandmother 59  . Brain cancer Maternal Grandmother 48  . Lung cancer Paternal Grandmother 39  . Diabetes Paternal Grandmother     Social History Social History   Tobacco Use  . Smoking status: Never Smoker  . Smokeless tobacco: Never Used  Substance Use Topics  . Alcohol use: No  . Drug use: No     Allergies   Patient has no known allergies.   Review of Systems Review of Systems  Constitutional: Negative for fever.  HENT: Positive for ear pain and sore throat.   Respiratory: Positive for cough.   Musculoskeletal:       Body aches.  Physical Exam Triage Vital Signs ED Triage Vitals  Enc Vitals Group     BP 11/19/17 0909 104/74     Pulse Rate 11/19/17 0909 62     Resp 11/19/17 0909 17     Temp 11/19/17 0909 98 F (36.7 C)     Temp Source 11/19/17 0909 Oral     SpO2 11/19/17 0909 98 %     Weight 11/19/17 0907 180 lb (81.6 kg)     Height --      Head Circumference --      Peak Flow --      Pain Score 11/19/17 0906 1     Pain Loc --      Pain Edu? --      Excl. in [place]? --    Updated Vital Signs BP 104/74 (BP Location: Left Arm)   Pulse 62   Temp 98 F (36.7 C) (Oral)   Resp 17   Wt 180 lb (81.6 kg)   SpO2 98%   BMI 30.90 kg/m   Physical Exam  Constitutional: She is oriented to person, place, and time. She appears well-developed. No distress.   HENT:  Oropharynx with mild erythema.  Left tonsil stone noted. TM's without evidence of infection bilaterally.  Eyes: Conjunctivae are normal.  Neck: Neck supple.  Cardiovascular: Normal rate and regular rhythm.  Pulmonary/Chest: Effort normal. She has no wheezes. She has no rales.  Lymphadenopathy:    She has no cervical adenopathy.  Neurological: She is alert and oriented to person, place, and time.  Psychiatric: She has a normal mood and affect. Her behavior is normal.  Nursing note and vitals reviewed.  UC Treatments / Results  Labs (all labs ordered are listed, but only abnormal results are displayed) Labs Reviewed - No data to display  EKG  EKG Interpretation None       Radiology No results found.  Procedures Procedures (including critical care time)  Medications Ordered in UC Medications - No data to display   Initial Impression / Assessment and Plan / UC Course  I have reviewed the triage vital signs and the nursing notes.  Pertinent labs & imaging results that were available during my care of the patient were reviewed by me and considered in my medical decision making (see chart for details).     42 year old female presents with an influenza-like illness advised supportive care.  Over-the-counter Zyrtec, Flonase.  Tussionex for cough.  Final Clinical Impressions(s) / UC Diagnoses   Final diagnoses:  Influenza-like illness    ED Discharge Orders        Ordered    chlorpheniramine-HYDROcodone (TUSSIONEX PENNKINETIC ER) 10-8 MG/5ML SUER  Every 12 hours PRN     11/19/17 0926     Controlled Substance Prescriptions [place] Controlled Substance Registry consulted? Not Applicable   Coral Spikes, [place] 11/19/17 [phone]

## 2018-01-29 ENCOUNTER — Ambulatory Visit
Admission: EM | Admit: 2018-01-29 | Discharge: 2018-01-29 | Disposition: A | Discharge status: Home / Self Care | Payer: Managed Care, Other (non HMO) | Attending: Family Medicine | Admitting: Family Medicine

## 2018-01-29 ENCOUNTER — Other Ambulatory Visit: Payer: Self-pay

## 2018-01-29 ENCOUNTER — Encounter: Payer: Self-pay | Admitting: Emergency Medicine

## 2018-01-29 DIAGNOSIS — R42 Dizziness and giddiness: Secondary | ICD-10-CM

## 2018-01-29 DIAGNOSIS — G43919 Migraine, unspecified, intractable, without status migrainosus: Secondary | ICD-10-CM | POA: Diagnosis not present

## 2018-01-29 DIAGNOSIS — R300 Dysuria: Secondary | ICD-10-CM | POA: Diagnosis not present

## 2018-01-29 LAB — URINALYSIS, COMPLETE (UACMP) WITH MICROSCOPIC
Bacteria, UA: NONE SEEN
Bilirubin Urine: NEGATIVE
Glucose, UA: NEGATIVE mg/dL
Ketones, ur: NEGATIVE mg/dL
Leukocytes, UA: NEGATIVE
Nitrite: NEGATIVE
Protein, ur: NEGATIVE mg/dL
Specific Gravity, Urine: 1.025 (ref 1.005–1.030)
Squamous Epithelial / LPF: NONE SEEN (ref 0–5)
pH: 5.5 (ref 5.0–8.0)

## 2018-01-29 MED ORDER — ONDANSETRON 8 MG PO TBDP: 8.0000 mg | ORAL_TABLET | Freq: Two times a day (BID) | ORAL | 0 refills | Status: DC

## 2018-01-29 MED ORDER — MECLIZINE HCL 25 MG PO TABS: 25.0000 mg | ORAL_TABLET | Freq: Three times a day (TID) | ORAL | 0 refills | Status: DC | PRN

## 2018-01-29 MED ORDER — ONDANSETRON 8 MG PO TBDP
8.0000 mg | ORAL_TABLET | Freq: Once | ORAL | Status: AC
  Administered 2018-01-29: 8 mg via ORAL

## 2018-01-29 MED ORDER — KETOROLAC TROMETHAMINE 60 MG/2ML IM SOLN
60.0000 mg | Freq: Once | INTRAMUSCULAR | Status: AC
  Administered 2018-01-29: 60 mg via INTRAMUSCULAR

## 2018-01-29 NOTE — ED Provider Notes (Signed)
[facility]    CSN: [id number] Arrival date & time: 01/29/18  0915     History   Chief Complaint Chief Complaint  Patient presents with  . Dizziness    HPI Kendra West is a 42 y.o. female.   HPI 42 year old female states that she awoke this morning, turned over in bed with immediate dizziness and headache.  Her ears have been ringing.  She has a history of vertigo which seems similar to what she has had before; not as severe as that time.  She states at that time they treated her with IV fluids and did improve.  States it is not like her usual migraines.  Also states that she has recently finished a course of prednisone and her urine had been quite dark but has lightened up to a pale yellow now.  However she does complain of frequency and urgency.  She denies any vaginal discharge.  She states that her dizziness is her spinning and not the room spinning.  He has experienced nausea but no vomiting.       Past Medical History:  Diagnosis Date  . Anxiety   . BRCA negative 08/2017   MyRIsk neg except MLH1 VUS; IBIS=5.6%  . Family history of ovarian cancer   . Hot flashes   . HPV (human papilloma virus) infection 2014   Pap Smear  . Interstitial cystitis     Patient Active Problem List   Diagnosis Date Noted  . Diarrhea 11/01/2017  . Nausea 11/01/2017  . Family history of ovarian cancer 08/13/2017    Past Surgical History:  Procedure Laterality Date  . PARTIAL HYSTERECTOMY      OB History    Gravida  2   Para  2   Term      Preterm      AB      Living        SAB      TAB      Ectopic      Multiple      Live Births               Home Medications    Prior to Admission medications   Medication Sig Start Date End Date Taking? Authorizing Provider  busPIRone (BUSPAR) 10 MG tablet Take 10 mg by mouth 3 (three) times daily.   Yes [provider]  fluticasone (FLONASE) 50 MCG/ACT nasal spray Place 2 sprays into both  nostrils daily. 05/27/16  Yes Lorin Picket, PA-C  hydrOXYzine (ATARAX/VISTARIL) 25 MG tablet Take 25 mg by mouth 3 (three) times daily as needed.   Yes [provider]  PARoxetine (PAXIL) 20 MG tablet Take 20 mg by mouth daily.   Yes [provider]  pentosan polysulfate (ELMIRON) 100 MG capsule Take 100 mg by mouth 3 (three) times daily.   Yes [provider]  rizatriptan (MAXALT) 10 MG tablet Take 10 mg by mouth as needed for migraine. May repeat in 2 hours if needed   Yes [provider]  SUMAtriptan (IMITREX) 100 MG tablet Take 100 mg by mouth every 2 (two) hours as needed for migraine. May repeat in 2 hours if headache persists or recurs.   Yes [provider]  meclizine (ANTIVERT) 25 MG tablet Take 1 tablet (25 mg total) by mouth 3 (three) times daily as needed for dizziness. 01/29/18   Lorin Picket, PA-C  ondansetron (ZOFRAN ODT) 8 MG disintegrating tablet Take 1 tablet (8 mg total)  by mouth 2 (two) times daily. 01/29/18   Lorin Picket, PA-C    Family History Family History  Problem Relation Age of Onset  . Ovarian cancer Maternal Grandmother 15  . Colon cancer Maternal Grandmother 36  . Brain cancer Maternal Grandmother 21  . Lung cancer Paternal Grandmother 11  . Diabetes Paternal Grandmother     Social History Social History   Tobacco Use  . Smoking status: Never Smoker  . Smokeless tobacco: Never Used  Substance Use Topics  . Alcohol use: Yes  . Drug use: No     Allergies   Patient has no known allergies.   Review of Systems Review of Systems  Constitutional: Positive for activity change. Negative for appetite change, chills, diaphoresis, fatigue and fever.  Genitourinary: Positive for frequency and urgency.  Neurological: Positive for dizziness.  All other systems reviewed and are negative.    Physical Exam Triage Vital Signs ED Triage Vitals  Enc Vitals Group     BP 01/29/18 0930 129/79     Pulse  Rate 01/29/18 0930 68     Resp 01/29/18 0930 16     Temp --      Temp src --      SpO2 01/29/18 0930 99 %     Weight 01/29/18 0924 180 lb (81.6 kg)     Height 01/29/18 0924 5' 4"  (1.626 m)     Head Circumference --      Peak Flow --      Pain Score 01/29/18 0924 7     Pain Loc --      Pain Edu? --      Excl. in [place]? --    Orthostatic VS for the past 24 hrs:  BP- Lying Pulse- Lying BP- Sitting Pulse- Sitting BP- Standing at 0 minutes Pulse- Standing at 0 minutes  01/29/18 1032 131/67 67 122/76 67 124/76 80    Updated Vital Signs BP 129/79   Pulse 68   Resp 16   Ht 5' 4"  (1.626 m)   Wt 180 lb (81.6 kg)   SpO2 99%   BMI 30.90 kg/m   Visual Acuity Right Eye Distance:   Left Eye Distance:   Bilateral Distance:    Right Eye Near:   Left Eye Near:    Bilateral Near:     Physical Exam  Constitutional: She is oriented to person, place, and time. She appears well-developed and well-nourished. No distress.  HENT:  Head: Normocephalic.  Right Ear: External ear normal.  Left Ear: External ear normal.  Nose: Nose normal.  Mouth/Throat: Oropharynx is clear and moist. No oropharyngeal exudate.  Eyes: Pupils are equal, round, and reactive to light. Right eye exhibits no discharge. Left eye exhibits no discharge.  Neck: Normal range of motion.  Pulmonary/Chest: Effort normal and breath sounds normal.  Musculoskeletal: Normal range of motion.  Lymphadenopathy:    She has no cervical adenopathy.  Neurological: She is alert and oriented to person, place, and time. She displays normal reflexes. No cranial nerve deficit or sensory deficit. She exhibits normal muscle tone. Coordination normal.  Skin: Skin is warm and dry. She is not diaphoretic.  Psychiatric: She has a normal mood and affect. Her behavior is normal. Judgment and thought content normal.  Nursing note and vitals reviewed.    UC Treatments / Results  Labs (all labs ordered are listed, but only abnormal results are  displayed) Labs Reviewed  URINALYSIS, COMPLETE (UACMP) WITH MICROSCOPIC - Abnormal; Notable for the  following components:      Result Value   Hgb urine dipstick TRACE (*)    All other components within normal limits    EKG None  Radiology No results found.  Procedures Procedures (including critical care time)  Medications Ordered in UC Medications  ondansetron (ZOFRAN-ODT) disintegrating tablet 8 mg (8 mg Oral Given 01/29/18 1019)  ketorolac (TORADOL) injection 60 mg (60 mg Intramuscular Given 01/29/18 1054)   The patient states that she had improvement after the injection 7 to a 5 Initial Impression / Assessment and Plan / UC Course  I have reviewed the triage vital signs and the nursing notes.  Pertinent labs & imaging results that were available during my care of the patient were reviewed by me and considered in my medical decision making (see chart for details).     Plan: 1. Test/x-ray results and diagnosis reviewed with patient 2. rx as per orders; risks, benefits, potential side effects reviewed with patient 3. Recommend supportive treatment with use of your usual migraine medicines at home.  Provide the Zofran for nausea so that you can have good fluid intake.  You worsen or not improving recommend you follow-up at the emergency room.  Also follow-up with your primary care physician.  Explained that her dizziness is likely a variable of migraine. 4. F/u prn if symptoms worsen or don't improve  Final Clinical Impressions(s) / UC Diagnoses   Final diagnoses:  Dizziness  Intractable migraine without status migrainosus, unspecified migraine type     Discharge Instructions     Go to the emergency room if you are not improving or if you are worsening    ED Prescriptions    Medication Sig Dispense Auth. Provider   meclizine (ANTIVERT) 25 MG tablet Take 1 tablet (25 mg total) by mouth 3 (three) times daily as needed for dizziness. 15 tablet Crecencio Mc P, PA-C    ondansetron (ZOFRAN ODT) 8 MG disintegrating tablet Take 1 tablet (8 mg total) by mouth 2 (two) times daily. 6 tablet Lorin Picket, PA-C     Controlled Substance Prescriptions [place] Controlled Substance Registry consulted? Not Applicable   Lorin Picket, PA-C 01/29/18 1505

## 2018-01-29 NOTE — ED Triage Notes (Signed)
Patient states she woke this morning with dizziness and headache, ears ringing.  Patient states she has a history of vertigo.

## 2018-01-29 NOTE — Discharge Instructions (Signed)
Go to the emergency room if you are not improving or if you are worsening

## 2018-08-30 ENCOUNTER — Ambulatory Visit
Admission: EM | Admit: 2018-08-30 | Discharge: 2018-08-30 | Disposition: A | Discharge status: Home / Self Care | Payer: Managed Care, Other (non HMO) | Attending: Family Medicine | Admitting: Family Medicine

## 2018-08-30 ENCOUNTER — Encounter: Payer: Self-pay | Admitting: Emergency Medicine

## 2018-08-30 ENCOUNTER — Other Ambulatory Visit: Payer: Self-pay

## 2018-08-30 DIAGNOSIS — H6692 Otitis media, unspecified, left ear: Secondary | ICD-10-CM | POA: Insufficient documentation

## 2018-08-30 DIAGNOSIS — G43909 Migraine, unspecified, not intractable, without status migrainosus: Secondary | ICD-10-CM | POA: Insufficient documentation

## 2018-08-30 MED ORDER — AMOXICILLIN-POT CLAVULANATE 875-125 MG PO TABS: 1.0000 | ORAL_TABLET | Freq: Two times a day (BID) | ORAL | 0 refills | Status: DC

## 2018-08-30 NOTE — ED Provider Notes (Signed)
[facility] ____________________________________________  Time seen: Approximately 6:20 PM  I have reviewed the triage vital signs and the nursing notes.   HISTORY  Chief Complaint Otalgia (appt left)  HPI Kendra West is a 42 y.o. female presenting for evaluation of left ear pain, intermittent nasal congestion and intermittent migraines.  Patient reports she had an ear infection in October that was treated with amoxicillin, and states it was the left ear, unsure if it ever fully resolved.  States that she has had some intermittent pain to her left ear since then, but reports over the last few weeks it has increased in more frequent.  States mild pain currently.  Denies injury or trauma.  Reports some intermittent nasal congestion and drainage, occasional sinus discomfort.  Denies current sore throat.  Some occasional cough.  No known fevers.  Patient reports that she has chronic migraines.  States that she feels that her ear complaint is triggering more frequent migraines.  States that she has a very mild headache currently but not a migraine.  States that her migraines have been intermittently coming and do respond well to her home medications, and states are normally to the right side of her head and throbbing.  Denies worst headache of her life or atypical migraine pain.  States has had occasional nausea with her migraine.  Continues eat and drink well.  Denies other aggravating alleviating factors.  Reports otherwise doing well.  Idelle Crouch, MD: PCP   Past Medical History:  Diagnosis Date  . Anxiety   . BRCA negative 08/2017   MyRIsk neg except MLH1 VUS; IBIS=5.6%  . Family history of ovarian cancer   . Hot flashes   . HPV (human papilloma virus) infection 2014   Pap Smear  . Interstitial cystitis     Patient Active Problem List   Diagnosis Date Noted  . Diarrhea 11/01/2017  . Nausea 11/01/2017  . Family history of ovarian cancer 08/13/2017    Past  Surgical History:  Procedure Laterality Date  . PARTIAL HYSTERECTOMY       No current facility-administered medications for this encounter.   Current Outpatient Medications:  .  FLUoxetine (PROZAC) 20 MG capsule, Take by mouth., Disp: , Rfl:  .  fluticasone (FLONASE) 50 MCG/ACT nasal spray, Place 2 sprays into both nostrils daily., Disp: 16 g, Rfl: 0 .  hydrOXYzine (ATARAX/VISTARIL) 25 MG tablet, Take 25 mg by mouth 3 (three) times daily as needed., Disp: , Rfl:  .  pentosan polysulfate (ELMIRON) 100 MG capsule, Take 100 mg by mouth 3 (three) times daily., Disp: , Rfl:  .  rizatriptan (MAXALT) 10 MG tablet, Take 10 mg by mouth as needed for migraine. May repeat in 2 hours if needed, Disp: , Rfl:  .  SUMAtriptan (IMITREX) 100 MG tablet, Take 100 mg by mouth every 2 (two) hours as needed for migraine. May repeat in 2 hours if headache persists or recurs., Disp: , Rfl:  .  amoxicillin-clavulanate (AUGMENTIN) 875-125 MG tablet, Take 1 tablet by mouth every 12 (twelve) hours., Disp: 20 tablet, Rfl: 0  Allergies Patient has no known allergies.  Family History  Problem Relation Age of Onset  . Ovarian cancer Maternal Grandmother 51  . Colon cancer Maternal Grandmother 80  . Brain cancer Maternal Grandmother 47  . Lung cancer Paternal Grandmother 81  . Diabetes Paternal Grandmother     Social History Social History   Tobacco Use  . Smoking status: Former Research scientist (life sciences)  . Smokeless tobacco: Never  Used  Substance Use Topics  . Alcohol use: Not Currently  . Drug use: No    Review of Systems Constitutional: No fever Eyes: No visual changes. ENT:As above.  Cardiovascular: Denies chest pain. Respiratory: Denies shortness of breath. Gastrointestinal: No abdominal pain.  Musculoskeletal: Negative for back pain. Skin: Negative for rash. Neurological: Negative for focal weakness or numbness. As above.    ____________________________________________   PHYSICAL EXAM:  VITAL SIGNS: ED  Triage Vitals  Enc Vitals Group     BP 08/30/18 1656 117/63     Pulse Rate 08/30/18 1656 64     Resp 08/30/18 1656 18     Temp 08/30/18 1656 98.7 F (37.1 C)     Temp Source 08/30/18 1656 Oral     SpO2 08/30/18 1656 99 %     Weight 08/30/18 1652 180 lb (81.6 kg)     Height 08/30/18 1652 5' 4"  (1.626 m)     Head Circumference --      Peak Flow --      Pain Score 08/30/18 1651 3     Pain Loc --      Pain Edu? --      Excl. in [place]? --     Constitutional: Alert and oriented. Well appearing and in no acute distress. Eyes: Conjunctivae are normal.  Head: Atraumatic. No sinus tenderness to palpation. No swelling. No erythema.  Ears: Left: Minimal tenderness with auricle movement, normal canal, moderate erythema, mild effusion present.  Right: Nontender, normal canal, no erythema, normal TM.  No mastoid tenderness bilaterally.  Nose: No nasal congestion.  Mouth/Throat: Mucous membranes are moist. No pharyngeal erythema. No tonsillar swelling or exudate.  Neck: No stridor.  No cervical spine tenderness to palpation. Hematological/Lymphatic/Immunilogical: No cervical lymphadenopathy. Cardiovascular: Normal rate, regular rhythm. Grossly normal heart sounds.  Good peripheral circulation. Respiratory: Normal respiratory effort.  No retractions. No wheezes, rales or rhonchi. Good air movement.  Musculoskeletal: Ambulatory with steady gait.  Neurologic:  Normal speech and language. No gait instability.  No focal neurological deficits.  Steady gait.  Negative Romberg.  Negative pronator drift.5/5 strength to bilateral upper and lower extremities. Skin:  Skin appears warm, dry and intact. No rash noted. Psychiatric: Mood and affect are normal. Speech and behavior are normal.  ___________________________________________   LABS (all labs ordered are listed, but only abnormal results are displayed)  Labs Reviewed - No data to display  PROCEDURES Procedures    INITIAL IMPRESSION / ASSESSMENT  AND PLAN / ED COURSE  Pertinent labs & imaging results that were available during my care of the patient were reviewed by me and considered in my medical decision making (see chart for details).  Well-appearing patient. No acute distress.  Left otitis noted. As was recently treated with amoxicillin, will treat with oral Augmentin.  Suspect migraines related to secondary to sickness.  Encourage supportive care and continue migraine treatment. Discussed indication, risks and benefits of medications with patient.   Discussed follow up with Primary care physician this week as needed. Discussed follow up and return parameters including no resolution or any worsening concerns. Patient verbalized understanding and agreed to plan.   ____________________________________________   FINAL CLINICAL IMPRESSION(S) / ED DIAGNOSES  Final diagnoses:  Left otitis media, unspecified otitis media type  Migraine without status migrainosus, not intractable, unspecified migraine type     ED Discharge Orders         Ordered    amoxicillin-clavulanate (AUGMENTIN) 875-125 MG tablet  Every 12 hours  08/30/18 1735           Note: This dictation was prepared with Dragon dictation along with smaller phrase technology. Any transcriptional errors that result from this process are unintentional.         Marylene Land, NP 08/30/18 1830

## 2018-08-30 NOTE — ED Triage Notes (Signed)
Pt c/o left ear pain, body aches intermittently for a couple of weeks, fatigue, and migraines.

## 2018-08-30 NOTE — Discharge Instructions (Addendum)
Take medication as prescribed. Rest. Drink plenty of fluids. Continue home migraine therapy as needed.   Follow up with your primary care physician this week as needed. Return to Urgent care for new or worsening concerns.

## 2018-09-15 ENCOUNTER — Other Ambulatory Visit: Payer: Self-pay

## 2018-09-15 ENCOUNTER — Encounter: Payer: Self-pay | Admitting: Gynecology

## 2018-09-15 ENCOUNTER — Ambulatory Visit
Admission: EM | Admit: 2018-09-15 | Discharge: 2018-09-15 | Disposition: A | Discharge status: Home / Self Care | Payer: Managed Care, Other (non HMO)

## 2018-09-15 DIAGNOSIS — R059 Cough, unspecified: Secondary | ICD-10-CM

## 2018-09-15 DIAGNOSIS — R05 Cough: Secondary | ICD-10-CM

## 2018-09-15 DIAGNOSIS — J069 Acute upper respiratory infection, unspecified: Secondary | ICD-10-CM

## 2018-09-15 MED ORDER — CHLORPHENIRAMINE-CODEINE 2-10 MG/5ML PO LIQD: 10.0000 mL | Freq: Every day | ORAL | 0 refills | Status: DC

## 2018-09-15 MED ORDER — BENZONATATE 100 MG PO CAPS: 200.0000 mg | ORAL_CAPSULE | Freq: Three times a day (TID) | ORAL | 0 refills | Status: AC

## 2018-09-15 NOTE — Discharge Instructions (Addendum)
URI/COLD SYMPTOMS: Your exam today is consistent with a viral illness. Antibiotics are not indicated at this time. Use medications as directed, including Mucinex D during the day, tessalon perles if needed to control cough during the day, and codeine containing cough syrup at bed time to assist sleep, nasal saline, and nasal decongestants. Your symptoms should improve over the next few days and resolve within 7-10 days. Increase rest and fluids. F/u if symptoms worsen or predominate such as sore throat, ear pain, productive cough, shortness of breath, or if you develop high fevers or worsening fatigue over the next several days.

## 2018-09-15 NOTE — ED Triage Notes (Signed)
Patient c/o x few days ago with cough / congestion.

## 2018-09-15 NOTE — ED Provider Notes (Signed)
[facility]    CSN: [id number] Arrival date & time: 09/15/18  8502     History   Chief Complaint Chief Complaint  Patient presents with  . URI    HPI Kendra West is a 43 y.o. female. Patient presents with 3 day history of nasal congestion, sinus pressure, sore throat, and productive cough. Denies fever, chills, sweats, fatigue, severe sinus pain, ear pain, chest pain, breathing difficulty. She says the cough is severe and keeping her awake at night. She has taken [employer], but no other medications. She was treated for ear infection with Omnicef less than 2 weeks ago. Denies any other concerns/complaints today.  HPI  Past Medical History:  Diagnosis Date  . Anxiety   . BRCA negative 08/2017   MyRIsk neg except MLH1 VUS; IBIS=5.6%  . Family history of ovarian cancer   . Hot flashes   . HPV (human papilloma virus) infection 2014   Pap Smear  . Interstitial cystitis     Patient Active Problem List   Diagnosis Date Noted  . Diarrhea 11/01/2017  . Nausea 11/01/2017  . Family history of ovarian cancer 08/13/2017    Past Surgical History:  Procedure Laterality Date  . PARTIAL HYSTERECTOMY      OB History    Gravida  2   Para  2   Term      Preterm      AB      Living        SAB      TAB      Ectopic      Multiple      Live Births               Home Medications    Prior to Admission medications   Medication Sig Start Date End Date Taking? Authorizing Provider  busPIRone (BUSPAR) 10 MG tablet Take by mouth.   Yes [provider]  cetirizine (ZYRTEC) 10 MG tablet Take by mouth. 12/01/16  Yes [provider]  FLUoxetine (PROZAC) 20 MG capsule Take by mouth. 04/16/18 04/16/19 Yes [provider]  fluticasone (FLONASE) 50 MCG/ACT nasal spray Place 2 sprays into both nostrils daily. 05/27/16  Yes Lorin Picket, PA-C  hydrOXYzine (ATARAX/VISTARIL) 25 MG tablet Take 25 mg by mouth 3 (three) times daily as  needed.   Yes [provider]  pentosan polysulfate (ELMIRON) 100 MG capsule Take 100 mg by mouth 3 (three) times daily.   Yes [provider]  rizatriptan (MAXALT) 10 MG tablet Take 10 mg by mouth as needed for migraine. May repeat in 2 hours if needed   Yes [provider]  amoxicillin-clavulanate (AUGMENTIN) 875-125 MG tablet Take 1 tablet by mouth every 12 (twelve) hours. 08/30/18   Marylene Land, NP  benzonatate (TESSALON) 100 MG capsule Take 2 capsules (200 mg total) by mouth 3 (three) times daily for 10 days. 09/15/18 09/25/18  Danton Clap, PA-C  Chlorpheniramine-Codeine 2-10 MG/5ML LIQD Take 10 mLs by mouth at bedtime. 09/15/18   Danton Clap, PA-C  SUMAtriptan (IMITREX) 100 MG tablet Take 100 mg by mouth every 2 (two) hours as needed for migraine. May repeat in 2 hours if headache persists or recurs.    [provider]    Family History Family History  Problem Relation Age of Onset  . Ovarian cancer Maternal Grandmother 35  . Colon cancer Maternal Grandmother 74  . Brain cancer Maternal Grandmother 51  . Lung cancer Paternal Grandmother  22  . Diabetes Paternal Grandmother     Social History Social History   Tobacco Use  . Smoking status: Former Research scientist (life sciences)  . Smokeless tobacco: Never Used  Substance Use Topics  . Alcohol use: Not Currently  . Drug use: No     Allergies   Patient has no known allergies.   Review of Systems Review of Systems  Constitutional: Negative for chills, fatigue and fever.  HENT: Positive for congestion, postnasal drip, rhinorrhea, sinus pressure, sinus pain and sore throat. Negative for ear pain.   Eyes: Negative for discharge and redness.  Respiratory: Positive for cough. Negative for shortness of breath and wheezing.   Cardiovascular: Negative for chest pain.  Gastrointestinal: Negative for abdominal pain, nausea and vomiting.  Musculoskeletal: Negative for arthralgias and myalgias.  Skin: Negative for  color change and rash.  Allergic/Immunologic: Negative for environmental allergies.  Neurological: Negative for weakness, light-headedness and headaches.  Hematological: Negative for adenopathy.     Physical Exam Triage Vital Signs ED Triage Vitals  Enc Vitals Group     BP 09/15/18 0838 113/72     Pulse Rate 09/15/18 0838 74     Resp 09/15/18 0838 16     Temp 09/15/18 0838 98 F (36.7 C)     Temp Source 09/15/18 0838 Oral     SpO2 09/15/18 0838 98 %     Weight 09/15/18 0837 180 lb (81.6 kg)     Height --      Head Circumference --      Peak Flow --      Pain Score 09/15/18 0837 6     Pain Loc --      Pain Edu? --      Excl. in [place]? --    No data found.  Updated Vital Signs BP 113/72 (BP Location: Left Arm)   Pulse 74   Temp 98 F (36.7 C) (Oral)   Resp 16   Wt 180 lb (81.6 kg)   SpO2 98%   BMI 30.90 kg/m       Physical Exam Vitals signs and nursing note reviewed.  Constitutional:      General: She is not in acute distress.    Appearance: Normal appearance. She is normal weight. She is not ill-appearing, toxic-appearing or diaphoretic.  HENT:     Head: Normocephalic and atraumatic.     Right Ear: Tympanic membrane, ear canal and external ear normal.     Left Ear: Tympanic membrane, ear canal and external ear normal.     Nose: Congestion and rhinorrhea (moderate purulent ) present.     Mouth/Throat:     Mouth: Mucous membranes are moist.     Pharynx: Posterior oropharyngeal erythema (mild erythema with clear PND) present.  Eyes:     General: No scleral icterus.       Right eye: No discharge.        Left eye: No discharge.  Neck:     Musculoskeletal: No muscular tenderness.  Cardiovascular:     Rate and Rhythm: Normal rate and regular rhythm.     Heart sounds: No murmur.  Pulmonary:     Effort: Pulmonary effort is normal. No respiratory distress.     Breath sounds: Normal breath sounds. No wheezing, rhonchi or rales.  Lymphadenopathy:     Cervical: No  cervical adenopathy.  Skin:    General: Skin is warm and dry.     Findings: No rash.  Neurological:     General: No focal  deficit present.     Mental Status: She is alert and oriented to person, place, and time.     Motor: No weakness.     Gait: Gait normal.  Psychiatric:        Mood and Affect: Mood normal.        Behavior: Behavior normal.        Thought Content: Thought content normal.      UC Treatments / Results  Labs (all labs ordered are listed, but only abnormal results are displayed) Labs Reviewed - No data to display  EKG None  Radiology No results found.  Procedures Procedures (including critical care time)  Medications Ordered in UC Medications - No data to display  Initial Impression / Assessment and Plan / UC Course  I have reviewed the triage vital signs and the nursing notes.  Pertinent labs & imaging results that were available during my care of the patient were reviewed by me and considered in my medical decision making (see chart for details).  Patient presenting with viral URI. Advised her no indication for antibiotics, especially since she just completed a course of Omnicef. Discussed supportive care plan and f/u if condition worsens or is not improved in the next 7-10 days.   Final Clinical Impressions(s) / UC Diagnoses   Final diagnoses:  Viral upper respiratory tract infection  Cough     Discharge Instructions     URI/COLD SYMPTOMS: Your exam today is consistent with a viral illness. Antibiotics are not indicated at this time. Use medications as directed, including Mucinex D during the day, tessalon perles if needed to control cough during the day, and codeine containing cough syrup at bed time to assist sleep, nasal saline, and nasal decongestants. Your symptoms should improve over the next few days and resolve within 7-10 days. Increase rest and fluids. F/u if symptoms worsen or predominate such as sore throat, ear pain, productive cough,  shortness of breath, or if you develop high fevers or worsening fatigue over the next several days.     ED Prescriptions    Medication Sig Dispense Auth. Provider   benzonatate (TESSALON) 100 MG capsule Take 2 capsules (200 mg total) by mouth 3 (three) times daily for 10 days. 60 capsule Laurene Footman B, PA-C   Chlorpheniramine-Codeine 2-10 MG/5ML LIQD Take 10 mLs by mouth at bedtime. 100 mL Laurene Footman B, PA-C     Controlled Substance Prescriptions [place] Controlled Substance Registry consulted? Yes, I have consulted the [place] Controlled Substances Registry for this patient, and feel the risk/benefit ratio today is favorable for proceeding with this prescription for a controlled substance.   Danton Clap, PA-C 09/16/18 2022

## 2019-04-26 DIAGNOSIS — E7800 Pure hypercholesterolemia, unspecified: Secondary | ICD-10-CM | POA: Insufficient documentation

## 2019-08-17 ENCOUNTER — Other Ambulatory Visit: Payer: Self-pay

## 2019-08-17 DIAGNOSIS — Z20822 Contact with and (suspected) exposure to covid-19: Secondary | ICD-10-CM

## 2019-08-19 LAB — NOVEL CORONAVIRUS, NAA: SARS-CoV-2, NAA: NOT DETECTED

## 2019-11-12 ENCOUNTER — Encounter: Payer: Self-pay | Admitting: Emergency Medicine

## 2019-11-12 ENCOUNTER — Other Ambulatory Visit: Payer: Self-pay

## 2019-11-12 ENCOUNTER — Ambulatory Visit
Admission: EM | Admit: 2019-11-12 | Discharge: 2019-11-12 | Disposition: A | Discharge status: Home / Self Care | Payer: Managed Care, Other (non HMO) | Attending: Family Medicine | Admitting: Family Medicine

## 2019-11-12 DIAGNOSIS — J069 Acute upper respiratory infection, unspecified: Secondary | ICD-10-CM

## 2019-11-12 DIAGNOSIS — Z20822 Contact with and (suspected) exposure to covid-19: Secondary | ICD-10-CM | POA: Diagnosis not present

## 2019-11-12 DIAGNOSIS — R5383 Other fatigue: Secondary | ICD-10-CM | POA: Diagnosis not present

## 2019-11-12 DIAGNOSIS — Z8041 Family history of malignant neoplasm of ovary: Secondary | ICD-10-CM | POA: Diagnosis not present

## 2019-11-12 DIAGNOSIS — Z87891 Personal history of nicotine dependence: Secondary | ICD-10-CM

## 2019-11-12 DIAGNOSIS — J029 Acute pharyngitis, unspecified: Secondary | ICD-10-CM | POA: Insufficient documentation

## 2019-11-12 DIAGNOSIS — R05 Cough: Secondary | ICD-10-CM | POA: Diagnosis not present

## 2019-11-12 DIAGNOSIS — Z79899 Other long term (current) drug therapy: Secondary | ICD-10-CM | POA: Insufficient documentation

## 2019-11-12 LAB — GROUP A STREP BY PCR: Group A Strep by PCR: NOT DETECTED

## 2019-11-12 MED ORDER — HYDROCOD POLST-CPM POLST ER 10-8 MG/5ML PO SUER: 5.0000 mL | Freq: Two times a day (BID) | ORAL | 0 refills | Status: DC

## 2019-11-12 MED ORDER — BENZONATATE 200 MG PO CAPS: ORAL_CAPSULE | ORAL | 0 refills | Status: DC

## 2019-11-12 NOTE — ED Triage Notes (Signed)
Patient c/o cough, congestion, runny nose and sore throat that started on Wed.  Patient denies fevers.

## 2019-11-12 NOTE — Discharge Instructions (Addendum)
You were tested for COVID-19 in our office today.  Please remain quarantined until results are available usually with an 18 to 24 hours.  If they are negative you may return to work if you are feeling improved.  If they are positive recommend following up with your primary care physician for further information and instructions

## 2019-11-12 NOTE — ED Provider Notes (Signed)
[facility]    CSN: [id number] Arrival date & time: 11/12/19  1287      History   Chief Complaint Chief Complaint  Patient presents with  . Cough  . Sore Throat    HPI Kendra West is a 44 y.o. female.   HPI  44 year old female presents with a 3-day history of nonproductive cough congestion runny nose and sore throat.  Had no fever or chills.  He is afebrile today.  O2 sats 100% respirations 14 blood pressure 114/73.  Not have any other people ill at home.  She works for [employer] but is working from home.  Her last Covid test in review of medical records was on 09/04/2019 which was negative.  She does not smoke.        Past Medical History:  Diagnosis Date  . Anxiety   . BRCA negative 08/2017   MyRIsk neg except MLH1 VUS; IBIS=5.6%  . Family history of ovarian cancer   . Hot flashes   . HPV (human papilloma virus) infection 2014   Pap Smear  . Interstitial cystitis     Patient Active Problem List   Diagnosis Date Noted  . Diarrhea 11/01/2017  . Nausea 11/01/2017  . Family history of ovarian cancer 08/13/2017    Past Surgical History:  Procedure Laterality Date  . PARTIAL HYSTERECTOMY      OB History    Gravida  2   Para  2   Term      Preterm      AB      Living        SAB      TAB      Ectopic      Multiple      Live Births               Home Medications    Prior to Admission medications   Medication Sig Start Date End Date Taking? Authorizing Provider  busPIRone (BUSPAR) 10 MG tablet Take by mouth.   Yes [provider]  cetirizine (ZYRTEC) 10 MG tablet Take by mouth. 12/01/16  Yes [provider]  fluticasone (FLONASE) 50 MCG/ACT nasal spray Place 2 sprays into both nostrils daily. 05/27/16  Yes Lorin Picket, PA-C  pentosan polysulfate (ELMIRON) 100 MG capsule Take 100 mg by mouth 3 (three) times daily.   Yes [provider]  rizatriptan (MAXALT) 10 MG tablet Take 10 mg by mouth  as needed for migraine. May repeat in 2 hours if needed   Yes [provider]  rosuvastatin (CRESTOR) 10 MG tablet Take by mouth. 10/20/19 10/19/20 Yes [provider]  SUMAtriptan (IMITREX) 100 MG tablet Take 100 mg by mouth every 2 (two) hours as needed for migraine. May repeat in 2 hours if headache persists or recurs.   Yes [provider]  topiramate (TOPAMAX) 50 MG tablet Take by mouth. 07/27/19  Yes [provider]  benzonatate (TESSALON) 200 MG capsule Take one cap TID PRN cough 11/12/19   Lorin Picket, PA-C  chlorpheniramine-HYDROcodone (TUSSIONEX PENNKINETIC ER) 10-8 MG/5ML SUER Take 5 mLs by mouth 2 (two) times daily. 11/12/19   Lorin Picket, PA-C  cyclobenzaprine (FLEXERIL) 10 MG tablet Take 10 mg by mouth at bedtime. 10/18/19   [provider]  FLUoxetine (PROZAC) 20 MG capsule Take by mouth. 04/16/18 04/16/19  [provider]    Family History Family History  Problem Relation Age of Onset  . Ovarian cancer Maternal Grandmother  50  . Colon cancer Maternal Grandmother 60  . Brain cancer Maternal Grandmother 10  . Lung cancer Paternal Grandmother 85  . Diabetes Paternal Grandmother     Social History Social History   Tobacco Use  . Smoking status: Former Research scientist (life sciences)  . Smokeless tobacco: Never Used  Substance Use Topics  . Alcohol use: Not Currently  . Drug use: No     Allergies   Patient has no known allergies.   Review of Systems Review of Systems  Constitutional: Positive for activity change and fatigue. Negative for appetite change, chills, diaphoresis and fever.  HENT: Positive for ear pain, sore throat and trouble swallowing.   Respiratory: Positive for cough. Negative for shortness of breath, wheezing and stridor.   All other systems reviewed and are negative.    Physical Exam Triage Vital Signs ED Triage Vitals  Enc Vitals Group     BP 11/12/19 0939 114/73     Pulse Rate 11/12/19 0939 68     Resp  11/12/19 0939 14     Temp 11/12/19 0939 98.1 F (36.7 C)     Temp Source 11/12/19 0939 Oral     SpO2 11/12/19 0939 100 %     Weight 11/12/19 0935 168 lb (76.2 kg)     Height 11/12/19 0935 5' 4"  (1.626 m)     Head Circumference --      Peak Flow --      Pain Score 11/12/19 0934 3     Pain Loc --      Pain Edu? --      Excl. in [place]? --    No data found.  Updated Vital Signs BP 114/73 (BP Location: Right Arm)   Pulse 68   Temp 98.1 F (36.7 C) (Oral)   Resp 14   Ht 5' 4"  (1.626 m)   Wt 168 lb (76.2 kg)   SpO2 100%   BMI 28.84 kg/m   Visual Acuity Right Eye Distance:   Left Eye Distance:   Bilateral Distance:    Right Eye Near:   Left Eye Near:    Bilateral Near:     Physical Exam Vitals and nursing note reviewed.  Constitutional:      General: She is not in acute distress.    Appearance: She is well-developed and normal weight. She is not ill-appearing, toxic-appearing or diaphoretic.  HENT:     Head: Normocephalic and atraumatic.     Right Ear: Tympanic membrane and ear canal normal.     Left Ear: Tympanic membrane and ear canal normal.     Nose: Congestion present.     Mouth/Throat:     Mouth: Mucous membranes are moist. No oral lesions.     Pharynx: Oropharynx is clear. No oropharyngeal exudate or posterior oropharyngeal erythema.     Tonsils: No tonsillar exudate or tonsillar abscesses. 1+ on the right.  Eyes:     Conjunctiva/sclera: Conjunctivae normal.  Cardiovascular:     Rate and Rhythm: Normal rate and regular rhythm.     Heart sounds: Normal heart sounds.  Pulmonary:     Effort: Pulmonary effort is normal.     Breath sounds: Normal breath sounds.  Musculoskeletal:     Cervical back: Normal range of motion and neck supple.  Skin:    General: Skin is warm and dry.  Neurological:     General: No focal deficit present.     Mental Status: She is alert and oriented to person, place, and time.  Psychiatric:        Mood and Affect: Mood normal.         Behavior: Behavior normal.      UC Treatments / Results  Labs (all labs ordered are listed, but only abnormal results are displayed) Labs Reviewed  GROUP A STREP BY PCR  NOVEL CORONAVIRUS, NAA (HOSP ORDER, SEND-OUT TO REF LAB; TAT 18-24 HRS)    EKG   Radiology No results found.  Procedures Procedures (including critical care time)  Medications Ordered in UC Medications - No data to display  Initial Impression / Assessment and Plan / UC Course  I have reviewed the triage vital signs and the nursing notes.  Pertinent labs & imaging results that were available during my care of the patient were reviewed by me and considered in my medical decision making (see chart for details).   44 year old female presents with 3-day history of nonproductive cough congestion runny nose and sore throat.  She is afebrile.  She has not been running a fever.  Pulse is 68 respirations 14 O2 sats 100% on room air.  I reviewed the strep test with her which was negative.  I told her that she likely has had a viral upper respiratory infection.  She will be treated symptomatically.  Prescribed Tessalon Perles for daytime use and Tussionex for nighttime to cough control.  Recommended she use Flonase nasal spray daily.  She will increase her fluid intake to keep her urine a clear to pale yellow color.  Should rest as much as possible.  Covid testing was obtained today and she should quarantine until the results are available in 18 to 24 hours.  If the test is negative she returned to work when she is feeling improving.  If it is positive I recommend she follow-up with her primary care physician for further recommendations.   Final Clinical Impressions(s) / UC Diagnoses   Final diagnoses:  Upper respiratory tract infection, unspecified type  Sore throat     Discharge Instructions     You were tested for COVID-19 in our office today.  Please remain quarantined until results are available usually with an 18  to 24 hours.  If they are negative you may return to work if you are feeling improved.  If they are positive recommend following up with your primary care physician for further information and instructions    ED Prescriptions    Medication Sig Dispense Auth. Provider   chlorpheniramine-HYDROcodone (TUSSIONEX PENNKINETIC ER) 10-8 MG/5ML SUER Take 5 mLs by mouth 2 (two) times daily. 115 mL Crecencio Mc P, PA-C   benzonatate (TESSALON) 200 MG capsule Take one cap TID PRN cough 30 capsule Lorin Picket, PA-C     I have reviewed the PDMP during this encounter.   Lorin Picket, PA-C 11/12/19 1035

## 2019-11-13 LAB — NOVEL CORONAVIRUS, NAA (HOSP ORDER, SEND-OUT TO REF LAB; TAT 18-24 HRS): SARS-CoV-2, NAA: NOT DETECTED

## 2020-07-30 ENCOUNTER — Other Ambulatory Visit: Payer: Self-pay | Admitting: Internal Medicine

## 2020-07-30 DIAGNOSIS — Z1231 Encounter for screening mammogram for malignant neoplasm of breast: Secondary | ICD-10-CM

## 2021-01-24 DIAGNOSIS — E538 Deficiency of other specified B group vitamins: Secondary | ICD-10-CM | POA: Insufficient documentation

## 2021-02-08 ENCOUNTER — Other Ambulatory Visit: Payer: Self-pay

## 2021-02-08 ENCOUNTER — Ambulatory Visit (INDEPENDENT_AMBULATORY_CARE_PROVIDER_SITE_OTHER): Payer: Managed Care, Other (non HMO) | Admitting: Urology

## 2021-02-08 ENCOUNTER — Encounter: Payer: Self-pay | Admitting: Urology

## 2021-02-08 ENCOUNTER — Other Ambulatory Visit
Admission: RE | Admit: 2021-02-08 | Discharge: 2021-02-08 | Disposition: A | Discharge status: Home / Self Care | Payer: Managed Care, Other (non HMO) | Attending: Urology | Admitting: Urology

## 2021-02-08 ENCOUNTER — Ambulatory Visit: Payer: Self-pay | Admitting: Urology

## 2021-02-08 VITALS — BP 99/61 | HR 69 | Ht 64.0 in | Wt 178.0 lb

## 2021-02-08 DIAGNOSIS — Z8669 Personal history of other diseases of the nervous system and sense organs: Secondary | ICD-10-CM | POA: Insufficient documentation

## 2021-02-08 DIAGNOSIS — N3010 Interstitial cystitis (chronic) without hematuria: Secondary | ICD-10-CM | POA: Insufficient documentation

## 2021-02-08 DIAGNOSIS — N809 Endometriosis, unspecified: Secondary | ICD-10-CM | POA: Insufficient documentation

## 2021-02-08 LAB — URINALYSIS, COMPLETE (UACMP) WITH MICROSCOPIC
Bacteria, UA: NONE SEEN
Bilirubin Urine: NEGATIVE
Glucose, UA: NEGATIVE mg/dL
Hgb urine dipstick: NEGATIVE
Ketones, ur: NEGATIVE mg/dL
Leukocytes,Ua: NEGATIVE
Nitrite: NEGATIVE
Protein, ur: NEGATIVE mg/dL
Specific Gravity, Urine: 1.01 (ref 1.005–1.030)
pH: 7 (ref 5.0–8.0)

## 2021-02-08 LAB — BLADDER SCAN AMB NON-IMAGING

## 2021-02-08 MED ORDER — OXYBUTYNIN CHLORIDE ER 10 MG PO TB24: 10.0000 mg | ORAL_TABLET | Freq: Every day | ORAL | 2 refills | Status: DC

## 2021-02-08 NOTE — Patient Instructions (Signed)
IC/BPS Flare Fact Sheet  Interstitial cystitis patients often struggle with "flares," a sudden and dramatic worsening of their bladder symptoms. Lasting from hours to weeks, flares can be unpredictable, disruptive and difficult to manage for newly diagnosed and veteran IC patients. Bladder wall irritation is the most common type of flare, often occurring after patients eat acidic foods, are under high stress or struggling with hormone changes. Pelvic floor muscle tension can also drive flares, particularly when those muscles are provoked through riding in a car or sexual intimacy. The good news is that flares are generally short term. They do, however, require prompt intervention to reduce/avoid the trigger and minimize the duration of the flare.  Typical Flare Triggers  DIET - 95% of patients report that certain foods exacerbate their IC symptoms. The most common offenders are foods high in acid, caffeine and alcohol.  DRIVING - 50% of IC patients report pain and discomfort while riding in a car, train, airplane or on a motorcycle. Patients are encouraged to avoid long rides until their condition has improved.  STRESS & ANXIETY - High periods of physical or emotional stress can exacerbate symptoms, including periods of intense cold, heat and emotional distress.  SEX & INTIMACY - Men with IC may experience pain at the moment of orgasm while women often feel their worst 24-48 hours after intercourse, the result of pelvic floor muscle spasms.  EXERCISE - Exercise that jars or puts pressure on the pelvic floor (i.e. riding a bicycle or motorcycle, spinning, running or stairs) can exacerbate pelvic pain and bladder symptoms. Exercises that keep the hips level are ideal, such as: walking, elliptical, rowing, gentle yoga and/or pilates.  HORMONES - Many women struggle with short term flares during ovulation and a few days before their period. Ironically, some patients flare when  their progesterone levels are higher, while others flare when their estrogen levels are higher. Post-menopausal women may experience more bladder, urethral and vulvar sensitivity due to dryness of the skin. Using a compounded, preservative-free estrogen cream may help patients with estrogen atrophy.  CHEMICAL EXPOSURE - Chemical exposures can trigger an IC flare, including: scented candles, room fresheners, cleansers, paints, solvents and pest control products. Patients with sensitive skin report that scented laundry detergent, fabric softeners and/or dryer sheets can trigger urethral, vulvar or rectal discomfort.  The [facility] offers more detailed information on flares and hour by hour rescue plans that can help reduce discomfort. 

## 2021-02-08 NOTE — Progress Notes (Signed)
02/08/2021 11:42 AM   Kristin Bruins 1976/06/17 [id number]  Referring provider: Idelle Crouch, MD [facility] [place],  [place] [address]  Chief Complaint  Patient presents with  . IC    New Patient    HPI: 45 yo female who presents today for further evaluation/management of interstitial cystitis.  She reports that she was diagnosed with interstitial cystitis about 20 years ago by Dr. Davis Gourd.  This was based on cystoscopic findings of "spots" in her bladder at the time of "partial" hysterectomy where her uterus only was removed.  She also has a personal history of endometriosis.  Around the time of diagnosis, she was having some pelvic/bladder pain symptoms.  She was placed on Elmiron.  She has been on Elmiron now for almost 20 years.  This was continued to be prescribed by her primary care physician, Dr. Doy Hutching.  More recently, her insurance denied this medication and required a urologist to prescribe it and thus she presents today for evaluation.  She reports that she has urinary frequency and sometimes some discomfort especially when she is holding it.  This can become painful in her suprapubic area.  She also sometimes has pain with urination but not dysuria specifically.  No hematuria or issues with infections.  She denies any bowel symptoms.  She is recently had a change in her diet and has started vitamins as well as a primarily plant-based and chicken/fish diet.  She thinks the vitamins may be irritating her.  She also is aware of IC diet and tries to follow this.  She denies any pain with intercourse.  She has had a few hot flashes but she reports that labs indicate that she is not in menopause yet.  She has trouble tracking her ovulation and is not sure if her pelvic pain exacerbations are related to her cycle.   PMH: Past Medical History:  Diagnosis Date  . Anxiety   . BRCA negative 08/2017   MyRIsk neg except MLH1 VUS; IBIS=5.6%   . Family history of ovarian cancer   . Hot flashes   . HPV (human papilloma virus) infection 2014   Pap Smear  . Interstitial cystitis     Surgical History: Past Surgical History:  Procedure Laterality Date  . PARTIAL HYSTERECTOMY      Home Medications:  Allergies as of 02/08/2021   No Known Allergies     Medication List       Accurate as of Feb 08, 2021 11:42 AM. If you have any questions, ask your nurse or doctor.        STOP taking these medications   benzonatate 200 MG capsule Commonly known as: TESSALON Stopped by: Hollice Espy, MD   busPIRone 10 MG tablet Commonly known as: BUSPAR Stopped by: Hollice Espy, MD   chlorpheniramine-HYDROcodone 10-8 MG/5ML Suer Commonly known as: Tussionex Pennkinetic ER Stopped by: Hollice Espy, MD   cyclobenzaprine 10 MG tablet Commonly known as: FLEXERIL Stopped by: Hollice Espy, MD     TAKE these medications   ALPRAZolam 0.25 MG tablet Commonly known as: XANAX Take 0.25 mg by mouth 2 (two) times daily as needed.   cetirizine 10 MG tablet Commonly known as: ZYRTEC Take by mouth.   FLUoxetine 20 MG capsule Commonly known as: PROZAC Take by mouth.   fluticasone 50 MCG/ACT nasal spray Commonly known as: FLONASE Place 2 sprays into both nostrils daily.   pentosan polysulfate 100 MG capsule Commonly known as: ELMIRON Take 100 mg  by mouth 3 (three) times daily.   rizatriptan 10 MG tablet Commonly known as: MAXALT Take 10 mg by mouth as needed for migraine. May repeat in 2 hours if needed   rosuvastatin 10 MG tablet Commonly known as: CRESTOR Take by mouth.   SUMAtriptan 100 MG tablet Commonly known as: IMITREX Take 100 mg by mouth every 2 (two) hours as needed for migraine. May repeat in 2 hours if headache persists or recurs.   topiramate 50 MG tablet Commonly known as: TOPAMAX Take by mouth.       Allergies: No Known Allergies  Family History: Family History  Problem Relation Age of Onset   . Ovarian cancer Maternal Grandmother 14  . Colon cancer Maternal Grandmother 53  . Brain cancer Maternal Grandmother 56  . Lung cancer Paternal Grandmother 102  . Diabetes Paternal Grandmother   . Prostate cancer Neg Hx   . Bladder Cancer Neg Hx   . Kidney cancer Neg Hx     Social History:  reports that she has quit smoking. She has never used smokeless tobacco. She reports previous alcohol use. She reports that she does not use drugs.   Physical Exam: BP 99/61   Pulse 69   Ht _0  (1.626 m)   Wt 178 lb (80.7 kg)   BMI 30.55 kg/m   Constitutional:  Alert and oriented, No acute distress. HEENT: [place] AT, moist mucus membranes.  Trachea midline, no masses. Cardiovascular: No clubbing, cyanosis, or edema. Respiratory: Normal respiratory effort, no increased work of breathing. Skin: No rashes, bruises or suspicious lesions. Neurologic: Grossly intact, no focal deficits, moving all 4 extremities. Psychiatric: Normal mood and affect.  Laboratory Data:  Urinalysis    Component Value Date/Time   COLORURINE YELLOW 02/08/2021 1036   APPEARANCEUR CLEAR 02/08/2021 1036   LABSPEC 1.010 02/08/2021 1036   PHURINE 7.0 02/08/2021 1036   GLUCOSEU NEGATIVE 02/08/2021 1036   HGBUR NEGATIVE 02/08/2021 1036   BILIRUBINUR NEGATIVE 02/08/2021 1036   KETONESUR NEGATIVE 02/08/2021 1036   PROTEINUR NEGATIVE 02/08/2021 1036   NITRITE NEGATIVE 02/08/2021 1036   LEUKOCYTESUR NEGATIVE 02/08/2021 1036    Lab Results  Component Value Date   BACTERIA NONE SEEN 02/08/2021   Results for orders placed or performed in visit on 02/08/21  BLADDER SCAN AMB NON-IMAGING  Result Value Ref Range   Scan Result 34m      Assessment & Plan:    1. IC (interstitial cystitis) It is a little unclear whether or not this is her true diagnosis as we no longer based the diagnosis and presence of cystoscopic glomerulation.  Her symptoms are somewhat nonspecific and may in fact be related to endometriosis or some  other chronic pelvic pain syndrome.  We discussed the diease pathophysiology is poorly understood therefore treatment has been focused primarily on symptomatic relief as well as dietary and behavioral modification. Information pamphlets were reviewed and given today discussing the current understanding of the syndrome as well as treatment options. IC dietary information also given today as many patients experience relief with simple lifestyle modifications.   At this point in time, I think it is reasonable to take her off of Elmiron due to concern for possible side effects and even incorrect diagnosis.  She does have some discomfort with bladder filling and urgency at times thus we will start her on Ditropan 10 mg XL and see whether or not this is effective.  Reassess her urinary symptoms and 6 weeks with PVR.   - BLADDER  SCAN AMB NON-IMAGING   Hollice Espy, MD  [facility] [address] [place], [place] [address] [phone]

## 2021-03-22 ENCOUNTER — Ambulatory Visit (INDEPENDENT_AMBULATORY_CARE_PROVIDER_SITE_OTHER): Payer: Managed Care, Other (non HMO) | Admitting: Urology

## 2021-03-22 ENCOUNTER — Other Ambulatory Visit: Payer: Self-pay

## 2021-03-22 VITALS — BP 109/72 | HR 84 | Ht 64.0 in | Wt 178.0 lb

## 2021-03-22 DIAGNOSIS — N3010 Interstitial cystitis (chronic) without hematuria: Secondary | ICD-10-CM | POA: Diagnosis not present

## 2021-03-22 LAB — BLADDER SCAN AMB NON-IMAGING: Scan Result: 15

## 2021-03-22 MED ORDER — OXYBUTYNIN CHLORIDE ER 10 MG PO TB24: 10.0000 mg | ORAL_TABLET | Freq: Every day | ORAL | 3 refills | Status: DC

## 2021-03-22 NOTE — Progress Notes (Signed)
03/22/2021 2:32 PM   Kendra West [birth year] [id number]  Referring provider: Idelle Crouch, MD [facility] [place],  [place] [address]  Chief Complaint  Patient presents with   Cystitis    HPI: 45 year old female with a personal history of IC who returns today for follow-up of med recheck.  Since last visit, we stopped her long-term Elmiron which she has been taking for 20 or so years.  She started on oxybutynin 10 mg XL which she is continuing to take.  She does have some slight dry mouth but this not bothersome to her and she just drinks water.  No other side effects.  She does report that this seems to control her urgency frequency symptoms that she related to IC.  She is only had 1 flare after eating salsa otherwise has been doing fairly well.   PMH: Past Medical History:  Diagnosis Date   Anxiety    BRCA negative 08/2017   MyRIsk neg except MLH1 VUS; IBIS=5.6%   Family history of ovarian cancer    Hot flashes    HPV (human papilloma virus) infection 2014   Pap Smear   Interstitial cystitis     Surgical History: Past Surgical History:  Procedure Laterality Date   PARTIAL HYSTERECTOMY      Home Medications:  Allergies as of 03/22/2021   No Known Allergies      Medication List        Accurate as of March 22, 2021  2:32 PM. If you have any questions, ask your nurse or doctor.          STOP taking these medications    pentosan polysulfate 100 MG capsule Commonly known as: ELMIRON Stopped by: Hollice Espy, MD       TAKE these medications    ALPRAZolam 0.25 MG tablet Commonly known as: XANAX Take 0.25 mg by mouth 2 (two) times daily as needed.   cetirizine 10 MG tablet Commonly known as: ZYRTEC Take by mouth.   FLUoxetine 20 MG capsule Commonly known as: PROZAC Take by mouth.   fluticasone 50 MCG/ACT nasal spray Commonly known as: FLONASE Place 2 sprays into both nostrils daily.   oxybutynin 10 MG  24 hr tablet Commonly known as: Ditropan XL Take 1 tablet (10 mg total) by mouth daily.   rizatriptan 10 MG tablet Commonly known as: MAXALT Take 10 mg by mouth as needed for migraine. May repeat in 2 hours if needed   rosuvastatin 10 MG tablet Commonly known as: CRESTOR Take by mouth.   SUMAtriptan 100 MG tablet Commonly known as: IMITREX Take 100 mg by mouth every 2 (two) hours as needed for migraine. May repeat in 2 hours if headache persists or recurs.   topiramate 50 MG tablet Commonly known as: TOPAMAX Take by mouth.        Allergies: No Known Allergies  Family History: Family History  Problem Relation Age of Onset   Ovarian cancer Maternal Grandmother 63   Colon cancer Maternal Grandmother 56   Brain cancer Maternal Grandmother 53   Lung cancer Paternal Grandmother 58   Diabetes Paternal Grandmother    Prostate cancer Neg Hx    Bladder Cancer Neg Hx    Kidney cancer Neg Hx     Social History:  reports that she has quit smoking. She has never used smokeless tobacco. She reports previous alcohol use. She reports that she does not use drugs.   Physical Exam: BP 109/72  Pulse 84   Ht 5' 4"  (1.626 m)   Wt 178 lb (80.7 kg)   BMI 30.55 kg/m   Constitutional:  Alert and oriented, No acute distress. HEENT: [place] AT, moist mucus membranes.  Trachea midline, no masses. Cardiovascular: No clubbing, cyanosis, or edema. Respiratory: Normal respiratory effort, no increased work of breathing. Skin: No rashes, bruises or suspicious lesions. Neurologic: Grossly intact, no focal deficits, moving all 4 extremities. Psychiatric: Normal mood and affect.  Results for orders placed or performed in visit on 03/22/21  BLADDER SCAN AMB NON-IMAGING  Result Value Ref Range   Scan Result 15 ml      Assessment & Plan:    1. IC (interstitial cystitis) Doing well on ditropan only, will continue this dose  We also discussed a trial of no meds, would recommend doing this when  she is under minimal stress as this can be a trigger for an IC flare  We discussed strategies for managing IC flares and indications for more prompt reevaluation  She is agreeable this plan, will follow-up next year with PVR - BLADDER SCAN AMB NON-IMAGING    Hollice Espy, MD  [facility] [address], [place], [place] [address] [phone]

## 2021-05-07 ENCOUNTER — Other Ambulatory Visit: Payer: Self-pay | Admitting: Urology

## 2021-06-05 ENCOUNTER — Encounter: Payer: Self-pay | Admitting: Obstetrics and Gynecology

## 2021-07-02 ENCOUNTER — Telehealth: Payer: Self-pay | Admitting: Obstetrics and Gynecology

## 2021-07-02 NOTE — Telephone Encounter (Signed)
LM for pt that previous MLH1 VUS is now of no clinical significance. No changes to mgmt. Hard copy mailed to pt. F/u prn questions.

## 2021-11-05 ENCOUNTER — Encounter: Payer: Self-pay | Admitting: Urology

## 2021-11-17 ENCOUNTER — Ambulatory Visit
Admission: EM | Admit: 2021-11-17 | Discharge: 2021-11-17 | Disposition: A | Discharge status: Home / Self Care | Payer: Managed Care, Other (non HMO) | Attending: Physician Assistant | Admitting: Physician Assistant

## 2021-11-17 ENCOUNTER — Other Ambulatory Visit: Payer: Self-pay

## 2021-11-17 ENCOUNTER — Ambulatory Visit (INDEPENDENT_AMBULATORY_CARE_PROVIDER_SITE_OTHER): Payer: Managed Care, Other (non HMO)

## 2021-11-17 DIAGNOSIS — R5383 Other fatigue: Secondary | ICD-10-CM | POA: Diagnosis present

## 2021-11-17 DIAGNOSIS — R059 Cough, unspecified: Secondary | ICD-10-CM

## 2021-11-17 DIAGNOSIS — R051 Acute cough: Secondary | ICD-10-CM | POA: Diagnosis not present

## 2021-11-17 DIAGNOSIS — R52 Pain, unspecified: Secondary | ICD-10-CM | POA: Diagnosis not present

## 2021-11-17 DIAGNOSIS — U071 COVID-19: Secondary | ICD-10-CM | POA: Insufficient documentation

## 2021-11-17 LAB — RESP PANEL BY RT-PCR (FLU A&B, COVID) ARPGX2
Influenza A by PCR: NEGATIVE
Influenza B by PCR: NEGATIVE
SARS Coronavirus 2 by RT PCR: POSITIVE — AB

## 2021-11-17 MED ORDER — MOLNUPIRAVIR 200 MG PO CAPS: 4.0000 | ORAL_CAPSULE | Freq: Two times a day (BID) | ORAL | 0 refills | Status: AC

## 2021-11-17 MED ORDER — PROMETHAZINE-DM 6.25-15 MG/5ML PO SYRP: 5.0000 mL | ORAL_SOLUTION | Freq: Four times a day (QID) | ORAL | 0 refills | Status: DC | PRN

## 2021-11-17 NOTE — Discharge Instructions (Signed)
-  COVID test is positive.  I sent COVID antiviral medicine to the pharmacy.  I believe you probably contracted COVID about 2 days ago when your body aches and cough and congestion significantly worsened.  In addition to the antiviral medicine I have also sent cough medication.  Isolate x5 days from symptom onset and wear mask for 5 days.  I provided you with a work note. ?- Increase rest and fluid intake. ?- If you have any uncontrolled fevers, weakness, chest pain or breathing difficulty you should call 911 or go to the ER. ?- You should expect to feel better after the next 7 to 10 days. ?

## 2021-11-17 NOTE — ED Provider Notes (Signed)
[facility]    CSN: [id number] Arrival date & time: 11/17/21  0801      History   Chief Complaint Chief Complaint  Patient presents with   Cough    HPI Kendra West is a 46 y.o. female presenting for approximately 2-week history of cough and congestion.  Patient reports over the past 2 days she has had increased body aches, congestion and cough.  Reports difficulty sleeping at night due to the cough.  She was initially seen 2 weeks ago by PCP and thought to have bronchitis.  Viral panel is negative.  Given amoxicillin and prednisone.  She reports no improvement in symptoms after taking.  She is also prescribed hydrocodone cough syrup.  Does not think it is helping.  No associated fevers.  Has had some sinus pressure.  Denies chest pain or breathing trouble.  No vomiting or diarrhea.  No sick contacts or known exposure to flu or COVID.  Patient does report she is going through a lot right now.  Reports she just recently buried a family member and another family members in the burn unit so she has not been able to rest through this illness.  She is presently taking over-the-counter Tylenol and Mucinex for symptoms.  No other concerns.  HPI  Past Medical History:  Diagnosis Date   Anxiety    BRCA negative 08/2017   MyRIsk neg except MLH1 VUS; IBIS=5.6%; 9/22 VUS of no clinical significance   Family history of ovarian cancer    Hot flashes    HPV (human papilloma virus) infection 2014   Pap Smear   Interstitial cystitis     Patient Active Problem List   Diagnosis Date Noted   Endometriosis 02/08/2021   History of migraine headaches 02/08/2021   Interstitial cystitis 02/08/2021   B12 deficiency 01/24/2021   Pure hypercholesterolemia 04/26/2019   Diarrhea 11/01/2017   Nausea 11/01/2017   Family history of ovarian cancer 08/13/2017   Anxiety and depression 06/14/2015    Past Surgical History:  Procedure Laterality Date   PARTIAL HYSTERECTOMY      OB History      Gravida  2   Para  2   Term      Preterm      AB      Living         SAB      IAB      Ectopic      Multiple      Live Births               Home Medications    Prior to Admission medications   Medication Sig Start Date End Date Taking? Authorizing Provider  ALPRAZolam (XANAX) 0.25 MG tablet Take 0.25 mg by mouth 2 (two) times daily as needed. 09/27/20  Yes [provider]  cetirizine (ZYRTEC) 10 MG tablet Take by mouth. 12/01/16  Yes [provider]  FLUoxetine (PROZAC) 20 MG capsule Take by mouth. 04/16/18  Yes [provider]  molnupiravir EUA (LAGEVRIO) 200 MG CAPS capsule Take 4 capsules (800 mg total) by mouth 2 (two) times daily for 5 days. 11/17/21 11/22/21 Yes Laurene Footman B, PA-C  oxybutynin (DITROPAN-XL) 10 MG 24 hr tablet TAKE 1 TABLET(10 MG) BY MOUTH DAILY 05/07/21  Yes Hollice Espy, MD  promethazine-dextromethorphan (PROMETHAZINE-DM) 6.25-15 MG/5ML syrup Take 5 mLs by mouth 4 (four) times daily as needed. 11/17/21  Yes Laurene Footman B, PA-C  rosuvastatin (CRESTOR) 10 MG tablet Take by mouth.  10/20/19  Yes [provider]  SUMAtriptan (IMITREX) 100 MG tablet Take 100 mg by mouth every 2 (two) hours as needed for migraine. May repeat in 2 hours if headache persists or recurs.   Yes [provider]  topiramate (TOPAMAX) 50 MG tablet Take by mouth. 07/27/19  Yes [provider]  fluticasone (FLONASE) 50 MCG/ACT nasal spray Place 2 sprays into both nostrils daily. 05/27/16   Lorin Picket, PA-C  rizatriptan (MAXALT) 10 MG tablet Take 10 mg by mouth as needed for migraine. May repeat in 2 hours if needed    [provider]    Family History Family History  Problem Relation Age of Onset   Ovarian cancer Maternal Grandmother 76   Colon cancer Maternal Grandmother 46   Brain cancer Maternal Grandmother 89   Lung cancer Paternal Grandmother 60   Diabetes Paternal Grandmother    Prostate cancer Neg  Hx    Bladder Cancer Neg Hx    Kidney cancer Neg Hx     Social History Social History   Tobacco Use   Smoking status: Former   Smokeless tobacco: Never  Scientific laboratory technician Use: Never used  Substance Use Topics   Alcohol use: Not Currently   Drug use: No     Allergies   Patient has no known allergies.   Review of Systems Review of Systems  Constitutional:  Positive for fatigue. Negative for chills, diaphoresis and fever.  HENT:  Positive for congestion, postnasal drip, rhinorrhea, sinus pressure and sore throat. Negative for ear pain.   Respiratory:  Positive for cough. Negative for shortness of breath and wheezing.   Cardiovascular:  Negative for chest pain.  Gastrointestinal:  Negative for abdominal pain, nausea and vomiting.  Musculoskeletal:  Positive for myalgias. Negative for arthralgias.  Skin:  Negative for rash.  Neurological:  Negative for weakness and headaches.  Hematological:  Negative for adenopathy.  Psychiatric/Behavioral:  Positive for sleep disturbance (due to cough).     Physical Exam Triage Vital Signs ED Triage Vitals  Enc Vitals Group     BP --      Pulse --      Resp --      Temp --      Temp src --      SpO2 --      Weight 11/17/21 0807 180 lb (81.6 kg)     Height 11/17/21 0807 5' 4"  (1.626 m)     Head Circumference --      Peak Flow --      Pain Score 11/17/21 0806 7     Pain Loc --      Pain Edu? --      Excl. in [place]? --    No data found.  Updated Vital Signs BP 106/69 (BP Location: Left Arm)    Pulse 84    Temp 98.6 F (37 C) (Oral)    Resp 20    Ht 5' 4"  (1.626 m)    Wt 180 lb (81.6 kg)    SpO2 99%    BMI 30.90 kg/m      Physical Exam Vitals and nursing note reviewed.  Constitutional:      General: She is not in acute distress.    Appearance: Normal appearance. She is ill-appearing. She is not toxic-appearing.  HENT:     Head: Normocephalic and atraumatic.     Right Ear: Tympanic membrane, ear canal and external ear  normal.  Left Ear: Tympanic membrane, ear canal and external ear normal.     Nose: Mucosal edema and congestion present.     Right Sinus: Maxillary sinus tenderness present.     Left Sinus: Maxillary sinus tenderness present.     Mouth/Throat:     Mouth: Mucous membranes are moist.     Pharynx: Oropharynx is clear. Posterior oropharyngeal erythema (mild with PND) present.  Eyes:     General: No scleral icterus.       Right eye: No discharge.        Left eye: No discharge.     Conjunctiva/sclera: Conjunctivae normal.  Cardiovascular:     Rate and Rhythm: Normal rate and regular rhythm.     Heart sounds: Normal heart sounds.  Pulmonary:     Effort: Pulmonary effort is normal. No respiratory distress.     Breath sounds: Normal breath sounds. No wheezing, rhonchi or rales.  Musculoskeletal:     Cervical back: Neck supple.  Skin:    General: Skin is dry.  Neurological:     General: No focal deficit present.     Mental Status: She is alert. Mental status is at baseline.     Motor: No weakness.     Gait: Gait normal.  Psychiatric:        Mood and Affect: Mood normal.        Behavior: Behavior normal.        Thought Content: Thought content normal.     UC Treatments / Results  Labs (all labs ordered are listed, but only abnormal results are displayed) Labs Reviewed  RESP PANEL BY RT-PCR (FLU A&B, COVID) ARPGX2 - Abnormal; Notable for the following components:      Result Value   SARS Coronavirus 2 by RT PCR POSITIVE (*)    All other components within normal limits    EKG   Radiology DG Chest 2 View  Result Date: 11/17/2021 CLINICAL DATA:  46 year old female with cough congestion and body aches. Former smoker. EXAM: CHEST - 2 VIEW COMPARISON:  Chest radiographs 12/15/2015. FINDINGS: Lung volumes and mediastinal contours remain normal. Visualized tracheal air column is within normal limits. Mildly increased interstitial markings throughout both lungs are stable since 2017.  No pneumothorax, pulmonary edema, pleural effusion or confluent pulmonary opacity. No acute osseous abnormality identified. Negative visible bowel gas. IMPRESSION: No acute cardiopulmonary abnormality. Electronically Signed   By: Genevie Ann M.D.   On: 11/17/2021 08:31    Procedures Procedures (including critical care time)  Medications Ordered in UC Medications - No data to display  Initial Impression / Assessment and Plan / UC Course  I have reviewed the triage vital signs and the nursing notes.  Pertinent labs & imaging results that were available during my care of the patient were reviewed by me and considered in my medical decision making (see chart for details).  46 year old female presenting for 2-week history of cough and congestion.  However, patient reports over the past 2 days she has developed body aches and worsening cough and congestion.  Vitals normal and stable.  Patient is ill-appearing but nontoxic.  On exam she does have nasal mucosal edema and congestion as well as maxillary sinus tenderness.  Chest is clear to auscultation and heart regular rate and rhythm.  Chest x-ray obtained to assess for the possibility of pneumonia given that she has been sick for 2 weeks with recent worsening of symptoms.  Additionally, respiratory panel obtained to see if she potentially became infected  with COVID-19 or influenza.  Chest x-rays interpreted as normal.  Respiratory panel is positive for COVID-19.  I discussed results with patient.  I suspect she probably started having COVID symptoms 2 days ago and everything got worse.  She was tested for COVID at the beginning of her 2-week illness and it was negative.  We will treat patient with Molnupiravir given that she is overweight and has a history of anxiety.  Also sent Promethazine DM to pharmacy.  Reviewed current CDC guidance, isolation protocol and ED precautions.  Work note provided.  Thoroughly reviewed return and ER precautions with patient  relating to illness.   Final Clinical Impressions(s) / UC Diagnoses   Final diagnoses:  COVID-19  Acute cough  Body aches     Discharge Instructions      -COVID test is positive.  I sent COVID antiviral medicine to the pharmacy.  I believe you probably contracted COVID about 2 days ago when your body aches and cough and congestion significantly worsened.  In addition to the antiviral medicine I have also sent cough medication.  Isolate x5 days from symptom onset and wear mask for 5 days.  I provided you with a work note. - Increase rest and fluid intake. - If you have any uncontrolled fevers, weakness, chest pain or breathing difficulty you should call 911 or go to the ER. - You should expect to feel better after the next 7 to 10 days.     ED Prescriptions     Medication Sig Dispense Auth. Provider   promethazine-dextromethorphan (PROMETHAZINE-DM) 6.25-15 MG/5ML syrup Take 5 mLs by mouth 4 (four) times daily as needed. 118 mL Laurene Footman B, PA-C   molnupiravir EUA (LAGEVRIO) 200 MG CAPS capsule Take 4 capsules (800 mg total) by mouth 2 (two) times daily for 5 days. 40 capsule Danton Clap, PA-C      I have reviewed the PDMP during this encounter.   Danton Clap, PA-C 11/17/21 [phone]

## 2021-11-17 NOTE — ED Triage Notes (Signed)
Patient c.o body aches and congestion. Patient states she does have chills. Patient denies fever. Patient ended her antibiotic over a week ago for URA ?

## 2021-12-29 ENCOUNTER — Other Ambulatory Visit: Payer: Self-pay

## 2021-12-29 ENCOUNTER — Encounter: Payer: Self-pay | Admitting: Emergency Medicine

## 2021-12-29 ENCOUNTER — Ambulatory Visit
Admission: EM | Admit: 2021-12-29 | Discharge: 2021-12-29 | Disposition: A | Discharge status: Home / Self Care | Payer: Managed Care, Other (non HMO) | Attending: Physician Assistant | Admitting: Physician Assistant

## 2021-12-29 DIAGNOSIS — R0981 Nasal congestion: Secondary | ICD-10-CM

## 2021-12-29 DIAGNOSIS — R051 Acute cough: Secondary | ICD-10-CM | POA: Diagnosis not present

## 2021-12-29 DIAGNOSIS — J309 Allergic rhinitis, unspecified: Secondary | ICD-10-CM | POA: Diagnosis not present

## 2021-12-29 MED ORDER — FLUTICASONE PROPIONATE 50 MCG/ACT NA SUSP: 2.0000 | Freq: Every day | NASAL | 0 refills | Status: AC

## 2021-12-29 MED ORDER — PROMETHAZINE-DM 6.25-15 MG/5ML PO SYRP: 5.0000 mL | ORAL_SOLUTION | Freq: Every evening | ORAL | 0 refills | Status: DC | PRN

## 2021-12-29 NOTE — Discharge Instructions (Signed)
-  Symptoms likely related to allergies.  Asked the pharmacist for Allegra-D.  Start using the Flonase I sent in continue using nasal saline.  Can use Afrin up to 3 days if needed. ?- Increase rest and fluids. ?- If no improvement in the next 5 days, follow-up with your PCP to see if antibiotics are indicated but they are not at this time. ?

## 2021-12-29 NOTE — ED Provider Notes (Signed)
?[facility] ? ? ? ?CSN: [id number] ?Arrival date & time: 12/29/21  0803 ? ? ?  ? ?History   ?Chief Complaint ?Chief Complaint  ?Patient presents with  ? Nasal Congestion  ? Headache  ? Cough  ? ? ?HPI ?Kendra West is a 46 y.o. female presenting for 6-day history of nasal congestion and sinus pressure without nasal drainage.  Also reports cough and postnasal drainage.  Denies fever, fatigue, aches, breathing difficulty.  Patient has used a Nettie pot and taken antihistamines.  Has a history of allergies.  Patient believes she may have a sinus infection.  She says everything is stuck in her sinuses and she cannot get it out.  She was diagnosed by me for COVID-19 last month.  Request a cough medication for at nighttime.  Says she cannot sleep because the cough.  No other complaints. ? ?HPI ? ?Past Medical History:  ?Diagnosis Date  ? Anxiety   ? BRCA negative 08/2017  ? MyRIsk neg except MLH1 VUS; IBIS=5.6%; 9/22 VUS of no clinical significance  ? Family history of ovarian cancer   ? Hot flashes   ? HPV (human papilloma virus) infection 2014  ? Pap Smear  ? Interstitial cystitis   ? ? ?Patient Active Problem List  ? Diagnosis Date Noted  ? Endometriosis 02/08/2021  ? History of migraine headaches 02/08/2021  ? Interstitial cystitis 02/08/2021  ? B12 deficiency 01/24/2021  ? Pure hypercholesterolemia 04/26/2019  ? Diarrhea 11/01/2017  ? Nausea 11/01/2017  ? Family history of ovarian cancer 08/13/2017  ? Anxiety and depression 06/14/2015  ? ? ?Past Surgical History:  ?Procedure Laterality Date  ? PARTIAL HYSTERECTOMY    ? ? ?OB History   ? ? Gravida  ?2  ? Para  ?2  ? Term  ?   ? Preterm  ?   ? AB  ?   ? Living  ?   ?  ? ? SAB  ?   ? IAB  ?   ? Ectopic  ?   ? Multiple  ?   ? Live Births  ?   ?   ?  ?  ? ? ? ?Home Medications   ? ?Prior to Admission medications   ?Medication Sig Start Date End Date Taking? Authorizing Provider  ?FLUoxetine (PROZAC) 20 MG capsule Take by mouth. 04/16/18  Yes [provider]  ?oxybutynin (DITROPAN-XL) 10 MG 24 hr tablet TAKE 1 TABLET(10 MG) BY MOUTH DAILY 05/07/21  Yes Hollice Espy, MD  ?rosuvastatin (CRESTOR) 10 MG tablet Take by mouth. 10/20/19  Yes [provider]  ?topiramate (TOPAMAX) 50 MG tablet Take by mouth. 07/27/19  Yes [provider]  ?ALPRAZolam (XANAX) 0.25 MG tablet Take 0.25 mg by mouth 2 (two) times daily as needed. 09/27/20   [provider]  ?cetirizine (ZYRTEC) 10 MG tablet Take by mouth. 12/01/16   [provider]  ?fluticasone (FLONASE) 50 MCG/ACT nasal spray Place 2 sprays into both nostrils daily. 12/29/21   Danton Clap, PA-C  ?promethazine-dextromethorphan (PROMETHAZINE-DM) 6.25-15 MG/5ML syrup Take 5 mLs by mouth at bedtime as needed. 12/29/21   Danton Clap, PA-C  ?rizatriptan (MAXALT) 10 MG tablet Take 10 mg by mouth as needed for migraine. May repeat in 2 hours if needed    [provider]  ?SUMAtriptan (IMITREX) 100 MG tablet Take 100 mg by mouth every 2 (two) hours as needed for migraine. May repeat in 2 hours if headache persists or recurs.  [provider]  ? ? ?Family History ?Family History  ?Problem Relation Age of Onset  ? Ovarian cancer Maternal Grandmother 16  ? Colon cancer Maternal Grandmother 75  ? Brain cancer Maternal Grandmother 46  ? Lung cancer Paternal Grandmother 63  ? Diabetes Paternal Grandmother   ? Prostate cancer Neg Hx   ? Bladder Cancer Neg Hx   ? Kidney cancer Neg Hx   ? ? ?Social History ?Social History  ? ?Tobacco Use  ? Smoking status: Former  ? Smokeless tobacco: Never  ?Vaping Use  ? Vaping Use: Never used  ?Substance Use Topics  ? Alcohol use: Not Currently  ? Drug use: No  ? ? ? ?Allergies   ?Patient has no known allergies. ? ? ?Review of Systems ?Review of Systems  ?Constitutional:  Negative for chills, diaphoresis, fatigue and fever.  ?HENT:  Positive for congestion, postnasal drip and sinus pressure. Negative for ear pain, rhinorrhea, sinus pain  and sore throat.   ?Respiratory:  Positive for cough. Negative for shortness of breath.   ?Gastrointestinal:  Negative for abdominal pain, nausea and vomiting.  ?Musculoskeletal:  Negative for arthralgias and myalgias.  ?Skin:  Negative for rash.  ?Neurological:  Positive for headaches. Negative for weakness.  ?Hematological:  Negative for adenopathy.  ? ? ?Physical Exam ?Triage Vital Signs ?ED Triage Vitals  ?Enc Vitals Group  ?   BP --   ?   Pulse --   ?   Resp --   ?   Temp --   ?   Temp src --   ?   SpO2 --   ?   Weight 12/29/21 0810 180 lb (81.6 kg)  ?   Height 12/29/21 0810 _0  (1.626 m)  ?   Head Circumference --   ?   Peak Flow --   ?   Pain Score 12/29/21 0809 4  ?   Pain Loc --   ?   Pain Edu? --   ?   Excl. in [place]? --   ? ?No data found. ? ?Updated Vital Signs ?BP 115/68 (BP Location: Left Arm)   Pulse 65   Temp 98 ?F (36.7 ?C) (Oral)   Resp 14   Ht _1  (1.626 m)   Wt 180 lb (81.6 kg)   SpO2 100%   BMI 30.90 kg/m?  ? ?Physical Exam ?Vitals and nursing note reviewed.  ?Constitutional:   ?   General: She is not in acute distress. ?   Appearance: Normal appearance. She is not ill-appearing or toxic-appearing.  ?HENT:  ?   Head: Normocephalic and atraumatic.  ?   Right Ear: Ear canal and external ear normal. A middle ear effusion (mild, clear) is present.  ?   Left Ear: Ear canal and external ear normal. A middle ear effusion (mild, clear) is present.  ?   Nose: Congestion present.  ?   Mouth/Throat:  ?   Mouth: Mucous membranes are moist.  ?   Pharynx: Oropharynx is clear.  ?   Comments: +PND ?Eyes:  ?   General: No scleral icterus.    ?   Right eye: No discharge.     ?   Left eye: No discharge.  ?   Conjunctiva/sclera: Conjunctivae normal.  ?Cardiovascular:  ?   Rate and Rhythm: Normal rate and regular rhythm.  ?   Heart sounds: Normal heart sounds.  ?Pulmonary:  ?   Effort: Pulmonary effort is normal. No respiratory distress.  ?  Breath sounds: Normal breath sounds.  ?Musculoskeletal:  ?    Cervical back: Neck supple.  ?Skin: ?   General: Skin is dry.  ?Neurological:  ?   General: No focal deficit present.  ?   Mental Status: She is alert. Mental status is at baseline.  ?   Motor: No weakness.  ?   Gait: Gait normal.  ?Psychiatric:     ?   Mood and Affect: Mood normal.     ?   Behavior: Behavior normal.     ?   Thought Content: Thought content normal.  ? ? ? ?UC Treatments / Results  ?Labs ?(all labs ordered are listed, but only abnormal results are displayed) ?Labs Reviewed - No data to display ? ?EKG ? ? ?Radiology ?No results found. ? ?Procedures ?Procedures (including critical care time) ? ?Medications Ordered in UC ?Medications - No data to display ? ?Initial Impression / Assessment and Plan / UC Course  ?I have reviewed the triage vital signs and the nursing notes. ? ?Pertinent labs & imaging results that were available during my care of the patient were reviewed by me and considered in my medical decision making (see chart for details). ? ?46 year old female presenting for 6-day history of nasal congestion, cough and postnasal drainage.  Patient denies any nasal drainage.  Symptoms not necessarily worsening.  Has history of allergies.  Not taking any nasal sprays.  Has been using antihistamines.  Vitals normal and stable patient overall well-appearing.  She does have significant nasal congestion without drainage and positive clear postnasal drainage as well as effusion of bilateral TMs that is clear.  Advised patient symptoms consistent with allergies.  Suggested she start using Flonase and I have sent that to pharmacy.  Also advised switching to Allegra-D.  Provided with Promethazine DM to take at bedtime.  Reviewed following up with PCP if no improvement in next 5 days or for worsening symptoms for consideration of antibiotic but not indicated at this time based on my exam. ? ? ?Final Clinical Impressions(s) / UC Diagnoses  ? ?Final diagnoses:  ?Allergic rhinitis, unspecified seasonality,  unspecified trigger  ?Nasal congestion  ?Acute cough  ? ? ? ?Discharge Instructions   ? ?  ?-Symptoms likely related to allergies.  Asked the pharmacist for [facility].  Start using the Flonase I sent in continue using nasal

## 2021-12-29 NOTE — ED Triage Notes (Addendum)
Patient c/o cough, nasal congestion, HAs, sinus pressure that started last Monday.  Patient denies fevers.  Patient states that she was diagnosed with COVID last month.  ?

## 2022-01-29 ENCOUNTER — Other Ambulatory Visit: Payer: Self-pay | Admitting: Internal Medicine

## 2022-01-29 ENCOUNTER — Ambulatory Visit
Admission: RE | Admit: 2022-01-29 | Discharge: 2022-01-29 | Disposition: A | Discharge status: Home / Self Care | Payer: Managed Care, Other (non HMO) | Source: Ambulatory Visit | Attending: Internal Medicine | Admitting: Internal Medicine

## 2022-01-29 DIAGNOSIS — Z1231 Encounter for screening mammogram for malignant neoplasm of breast: Secondary | ICD-10-CM | POA: Diagnosis not present

## 2022-01-31 ENCOUNTER — Inpatient Hospital Stay
Admission: RE | Admit: 2022-01-31 | Discharge: 2022-01-31 | Disposition: A | Discharge status: Home / Self Care | Payer: Self-pay | Source: Ambulatory Visit | Attending: *Deleted | Admitting: *Deleted

## 2022-01-31 ENCOUNTER — Other Ambulatory Visit: Payer: Self-pay | Admitting: *Deleted

## 2022-01-31 DIAGNOSIS — Z1231 Encounter for screening mammogram for malignant neoplasm of breast: Secondary | ICD-10-CM

## 2022-03-17 ENCOUNTER — Other Ambulatory Visit: Payer: Self-pay

## 2022-03-17 ENCOUNTER — Encounter: Payer: Self-pay | Admitting: Urology

## 2022-03-17 ENCOUNTER — Ambulatory Visit (INDEPENDENT_AMBULATORY_CARE_PROVIDER_SITE_OTHER): Payer: Managed Care, Other (non HMO) | Admitting: Urology

## 2022-03-17 ENCOUNTER — Ambulatory Visit: Payer: Managed Care, Other (non HMO) | Admitting: Urology

## 2022-03-17 VITALS — BP 114/70 | HR 66 | Ht 64.0 in | Wt 180.0 lb

## 2022-03-17 DIAGNOSIS — N3010 Interstitial cystitis (chronic) without hematuria: Secondary | ICD-10-CM

## 2022-03-17 LAB — BLADDER SCAN AMB NON-IMAGING: Scan Result: 14

## 2022-03-17 MED ORDER — OXYBUTYNIN CHLORIDE ER 10 MG PO TB24: ORAL_TABLET | ORAL | 3 refills | Status: DC

## 2022-03-17 NOTE — Progress Notes (Incomplete)
03/17/22 7:08 AM   Kristin Bruins 11/20/1975 [id number]  Referring provider:  Idelle Crouch, MD [facility] [place],  [place] [address] No chief complaint on file.   Urological history     HPI: Kendra West is a 46 y.o.female      PMH: Past Medical History:  Diagnosis Date   Anxiety    BRCA negative 08/2017   MyRIsk neg except MLH1 VUS; IBIS=5.6%; 9/22 VUS of no clinical significance   Family history of ovarian cancer    Hot flashes    HPV (human papilloma virus) infection 2014   Pap Smear   Interstitial cystitis     Surgical History: Past Surgical History:  Procedure Laterality Date   PARTIAL HYSTERECTOMY      Home Medications:  Allergies as of 03/17/2022   No Known Allergies      Medication List        Accurate as of March 17, 2022  7:08 AM. If you have any questions, ask your nurse or doctor.          ALPRAZolam 0.25 MG tablet Commonly known as: XANAX Take 0.25 mg by mouth 2 (two) times daily as needed.   cetirizine 10 MG tablet Commonly known as: ZYRTEC Take by mouth.   FLUoxetine 20 MG capsule Commonly known as: PROZAC Take by mouth.   fluticasone 50 MCG/ACT nasal spray Commonly known as: FLONASE Place 2 sprays into both nostrils daily.   oxybutynin 10 MG 24 hr tablet Commonly known as: DITROPAN-XL TAKE 1 TABLET(10 MG) BY MOUTH DAILY   promethazine-dextromethorphan 6.25-15 MG/5ML syrup Commonly known as: PROMETHAZINE-DM Take 5 mLs by mouth at bedtime as needed.   rizatriptan 10 MG tablet Commonly known as: MAXALT Take 10 mg by mouth as needed for migraine. May repeat in 2 hours if needed   rosuvastatin 10 MG tablet Commonly known as: CRESTOR Take by mouth.   SUMAtriptan 100 MG tablet Commonly known as: IMITREX Take 100 mg by mouth every 2 (two) hours as needed for migraine. May repeat in 2 hours if headache persists or recurs.   topiramate 50 MG tablet Commonly known as: TOPAMAX Take by  mouth.        Allergies:  No Known Allergies  Family History: Family History  Problem Relation Age of Onset   Ovarian cancer Maternal Grandmother 11   Colon cancer Maternal Grandmother 21   Brain cancer Maternal Grandmother 72   Lung cancer Paternal Grandmother 9   Diabetes Paternal Grandmother    Prostate cancer Neg Hx    Bladder Cancer Neg Hx    Kidney cancer Neg Hx    Breast cancer Neg Hx     Social History:  reports that she has quit smoking. She has never used smokeless tobacco. She reports that she does not currently use alcohol. She reports that she does not use drugs.   Physical Exam: There were no vitals taken for this visit.  Constitutional:  Alert and oriented, No acute distress. HEENT: [place] AT, moist mucus membranes.  Trachea midline, no masses. Cardiovascular: No clubbing, cyanosis, or edema. Respiratory: Normal respiratory effort, no increased work of breathing. GI: Abdomen is soft, nontender, nondistended, no abdominal masses GU: No CVA tenderness Lymph: No cervical or inguinal lymphadenopathy. Skin: No rashes, bruises or suspicious lesions. Neurologic: Grossly intact, no focal deficits, moving all 4 extremities. Psychiatric: Normal mood and affect.  Laboratory Data:  No results found for: "CREATININE"   No results found for: "HGBA1C"  Urinalysis   Pertinent Imaging:   Assessment & Plan:     No follow-ups on file.  [facility] [address], [place], [place] [address] [phone]  I,Kailey Littlejohn,acting as a scribe for [employer], PA-C.,have documented all relevant documentation on the behalf of SHANNON MCGOWAN, PA-C,as directed by  [facility], PA-C while in the presence of [place], PA-C.

## 2022-03-17 NOTE — Addendum Note (Signed)
Addended by: Veneta Penton on: 03/17/2022 12:02 PM   Modules accepted: Orders

## 2022-03-17 NOTE — Progress Notes (Signed)
03/17/22 12:11 PM   Kristin Bruins [birth year] [id number]  Referring provider:  Idelle Crouch, MD [facility] [place],  [place] [address]  Chief Complaint  Patient presents with   Cystitis    Urological history  IC  - Managed on Ditropan only - PVR 14 ml  HPI: Kendra West is a 47 y.o.female who presents today for a 1 year symptom recheck.   She rushes to the bathroom 0-3 x daily she uses restroom 7x daily, doesn't limit fluids. She does engage and had 0-3 episodes urinary incontinence 0- 3 episodes of nocturia this week.   Patient denies any modifying or aggravating factors.  Patient denies any gross hematuria, dysuria or suprapubic/flank pain.  Patient denies any fevers, chills, nausea or vomiting.   She is taking the oxybutynin XL 10 mg nightly.  She is having minimal dry mouth.     PMH: Past Medical History:  Diagnosis Date   Anxiety    BRCA negative 08/2017   MyRIsk neg except MLH1 VUS; IBIS=5.6%; 9/22 VUS of no clinical significance   Family history of ovarian cancer    Hot flashes    HPV (human papilloma virus) infection 2014   Pap Smear   Interstitial cystitis     Surgical History: Past Surgical History:  Procedure Laterality Date   PARTIAL HYSTERECTOMY      Home Medications:  Allergies as of 03/17/2022   No Known Allergies      Medication List        Accurate as of March 17, 2022 12:11 PM. If you have any questions, ask your nurse or doctor.          STOP taking these medications    promethazine-dextromethorphan 6.25-15 MG/5ML syrup Commonly known as: PROMETHAZINE-DM Stopped by: Zara Council, PA-C       TAKE these medications    ALPRAZolam 0.25 MG tablet Commonly known as: XANAX Take 0.25 mg by mouth 2 (two) times daily as needed.   cetirizine 10 MG tablet Commonly known as: ZYRTEC Take by mouth.   FLUoxetine 20 MG capsule Commonly known as: PROZAC Take by mouth.   fluticasone 50 MCG/ACT  nasal spray Commonly known as: FLONASE Place 2 sprays into both nostrils daily.   oxybutynin 10 MG 24 hr tablet Commonly known as: DITROPAN-XL TAKE 1 TABLET(10 MG) BY MOUTH DAILY   rizatriptan 10 MG tablet Commonly known as: MAXALT Take 10 mg by mouth as needed for migraine. May repeat in 2 hours if needed   rosuvastatin 10 MG tablet Commonly known as: CRESTOR Take by mouth.   SUMAtriptan 100 MG tablet Commonly known as: IMITREX Take 100 mg by mouth every 2 (two) hours as needed for migraine. May repeat in 2 hours if headache persists or recurs.   topiramate 50 MG tablet Commonly known as: TOPAMAX Take by mouth.        Allergies:  No Known Allergies  Family History: Family History  Problem Relation Age of Onset   Ovarian cancer Maternal Grandmother 69   Colon cancer Maternal Grandmother 3   Brain cancer Maternal Grandmother 30   Lung cancer Paternal Grandmother 66   Diabetes Paternal Grandmother    Prostate cancer Neg Hx    Bladder Cancer Neg Hx    Kidney cancer Neg Hx    Breast cancer Neg Hx     Social History:  reports that she has quit smoking. She has never used smokeless tobacco. She reports that she does not currently  use alcohol. She reports that she does not use drugs.   Physical Exam: BP 114/70   Pulse 66   Ht _0  (1.626 m)   Wt 180 lb (81.6 kg)   BMI 30.90 kg/m   Constitutional:  Alert and oriented, No acute distress. HEENT: [place] AT, moist mucus membranes.  Trachea midline Cardiovascular: No clubbing, cyanosis, or edema. Respiratory: Normal respiratory effort, no increased work of breathing. Neurologic: Grossly intact, no focal deficits, moving all 4 extremities. Psychiatric: Normal mood and affect.  Laboratory Data: Urinalysis Pending will get from [place]   Pertinent Imaging: Results for orders placed or performed in visit on 03/17/22  BLADDER SCAN AMB NON-IMAGING  Result Value Ref Range   Scan Result 14 ml      Assessment & Plan:    IC  - Doing well on ditropan only, will continue this dose; refill sent  - Will go to [place] sometime this week to get her urinalysis  - BLADDER SCAN AMB NON-IMAGING  Return in about 1 year (around 03/18/2023) for oab, ua and office visit .  [place] [address] [place],  [address] [phone]  I, Kirke Shaggy Littlejohn,acting as a scribe for [facility], PA-C.,have documented all relevant documentation on the behalf of Glee Lashomb, PA-C,as directed by  [facility], PA-C while in the presence of [place], PA-C.  I have reviewed the above documentation for accuracy and completeness, and I agree with the above.    Zara Council, PA-C

## 2022-03-24 ENCOUNTER — Ambulatory Visit: Payer: Self-pay | Admitting: Urology

## 2022-03-26 LAB — URINALYSIS, COMPLETE
Bilirubin, UA: NEGATIVE
Glucose, UA: NEGATIVE
Ketones, UA: NEGATIVE
Leukocytes,UA: NEGATIVE
Nitrite, UA: NEGATIVE
Protein,UA: NEGATIVE
RBC, UA: NEGATIVE
Specific Gravity, UA: 1.024 (ref 1.005–1.030)
Urobilinogen, Ur: 0.2 mg/dL (ref 0.2–1.0)
pH, UA: 6 (ref 5.0–7.5)

## 2022-03-26 LAB — MICROSCOPIC EXAMINATION
Casts: NONE SEEN /lpf
Epithelial Cells (non renal): >10 /hpf — AB (ref 0–10)
RBC, Urine: NONE SEEN /hpf (ref 0–2)
WBC, UA: NONE SEEN /hpf (ref 0–5)

## 2022-04-01 ENCOUNTER — Telehealth: Payer: Self-pay

## 2022-04-01 MED ORDER — FLUCONAZOLE 100 MG PO TABS: 100.0000 mg | ORAL_TABLET | Freq: Every day | ORAL | 0 refills | Status: AC

## 2022-04-01 NOTE — Telephone Encounter (Signed)
-----   Message from Harle Battiest, PA-C sent at 03/30/2022  1:12 PM EDT ----- Please let Mr. Lyne know that her urine was mostly clear, but she did have some yeast present.  Is she having a vaginal discharge or vaginal itching?

## 2022-04-01 NOTE — Telephone Encounter (Signed)
Sent Diflucan to pt's pharmacy, pt notified.

## 2022-04-01 NOTE — Telephone Encounter (Signed)
Notified pt as advised. Pt reports she is having vaginal discharge, she denies itching.

## 2022-04-01 NOTE — Addendum Note (Signed)
Addended by: Lizbeth Bark on: 04/01/2022 01:11 PM   Modules accepted: Orders

## 2022-12-25 ENCOUNTER — Telehealth: Payer: Self-pay | Admitting: Urology

## 2022-12-25 ENCOUNTER — Other Ambulatory Visit: Payer: Self-pay | Admitting: *Deleted

## 2022-12-25 MED ORDER — OXYBUTYNIN CHLORIDE ER 10 MG PO TB24: ORAL_TABLET | ORAL | 0 refills | Status: DC

## 2022-12-25 NOTE — Telephone Encounter (Signed)
Pt called back to r/s due to Kendra West being out of the office on 7/8.  She will need at least a 1 month refill of Oxybutynin sent to [facility] in [place].  She will be out before appt.

## 2022-12-25 NOTE — Telephone Encounter (Signed)
One month  of oxbutynin sent

## 2023-01-21 ENCOUNTER — Other Ambulatory Visit: Payer: Self-pay | Admitting: Internal Medicine

## 2023-01-21 DIAGNOSIS — Z1231 Encounter for screening mammogram for malignant neoplasm of breast: Secondary | ICD-10-CM

## 2023-02-06 ENCOUNTER — Ambulatory Visit
Admission: RE | Admit: 2023-02-06 | Discharge: 2023-02-06 | Disposition: A | Discharge status: Home / Self Care | Payer: Managed Care, Other (non HMO) | Source: Ambulatory Visit | Attending: Internal Medicine | Admitting: Internal Medicine

## 2023-02-06 DIAGNOSIS — Z1231 Encounter for screening mammogram for malignant neoplasm of breast: Secondary | ICD-10-CM | POA: Insufficient documentation

## 2023-03-17 NOTE — Progress Notes (Signed)
03/18/23 4:05 PM   Kendra West August 08, 1976 [id number]  Referring provider:  Marguarite Arbour, MD [address] [facility] [place],  [place] [address]  Urological history  IC  - Ditropan XL 10 mg daily    Chief Complaint  Patient presents with   Cystitis   Over Active Bladder     HPI: Kendra West is a 47 y.o.female who presents today for a 1 year symptom recheck.   She is having 1-7 daytime voids, nocturia x 1-2 and a mild urge to urinate.  She is having stress urinary incontinence.  Patient denies any modifying or aggravating factors.  Patient denies any recent UTI's, gross hematuria, dysuria or suprapubic/flank pain.  Patient denies any fevers, chills, nausea or vomiting.   UA yellow clear, specific gravity 1.020, pH 7.0, 0-5 WBCs, 0-2 RBCs, greater than 10 epithelial cells, amorphous sediment present and moderate bacteria  PVR 0 mL   PMH: Past Medical History:  Diagnosis Date   Anxiety    BRCA negative 08/2017   MyRIsk neg except MLH1 VUS; IBIS=5.6%; 9/22 VUS of no clinical significance   Family history of ovarian cancer    Hot flashes    HPV (human papilloma virus) infection 2014   Pap Smear   Interstitial cystitis     Surgical History: Past Surgical History:  Procedure Laterality Date   PARTIAL HYSTERECTOMY      Home Medications:  Allergies as of 03/18/2023   No Known Allergies      Medication List        Accurate as of March 18, 2023  4:05 PM. If you have any questions, ask your nurse or doctor.          ALPRAZolam 0.25 MG tablet Commonly known as: XANAX Take 0.25 mg by mouth 2 (two) times daily as needed.   cetirizine 10 MG tablet Commonly known as: ZYRTEC Take by mouth.   fluconazole 100 MG tablet Commonly known as: DIFLUCAN Take 1 tablet (100 mg total) by mouth daily. X 7 days   FLUoxetine 20 MG capsule Commonly known as: PROZAC Take by mouth.   fluticasone 50 MCG/ACT nasal spray Commonly known as:  FLONASE Place 2 sprays into both nostrils daily.   metFORMIN 500 MG tablet Commonly known as: GLUCOPHAGE Take by mouth.   oxybutynin 10 MG 24 hr tablet Commonly known as: DITROPAN-XL TAKE 1 TABLET(10 MG) BY MOUTH DAILY   rizatriptan 10 MG tablet Commonly known as: MAXALT Take 10 mg by mouth as needed for migraine. May repeat in 2 hours if needed   rosuvastatin 10 MG tablet Commonly known as: CRESTOR Take by mouth.   SUMAtriptan 100 MG tablet Commonly known as: IMITREX Take 100 mg by mouth every 2 (two) hours as needed for migraine. May repeat in 2 hours if headache persists or recurs.   topiramate 50 MG tablet Commonly known as: TOPAMAX Take by mouth.        Allergies:  No Known Allergies  Family History: Family History  Problem Relation Age of Onset   Ovarian cancer Maternal Grandmother 39   Colon cancer Maternal Grandmother 68   Brain cancer Maternal Grandmother 48   Lung cancer Paternal Grandmother 5   Diabetes Paternal Grandmother    Prostate cancer Neg Hx    Bladder Cancer Neg Hx    Kidney cancer Neg Hx    Breast cancer Neg Hx     Social History:  reports that she has quit smoking. She has never used  smokeless tobacco. She reports that she does not currently use alcohol. She reports that she does not use drugs.   Physical Exam: BP 115/76   Pulse 81   Wt 186 lb (84.4 kg)   BMI 31.93 kg/m   Constitutional:  Well nourished. Alert and oriented, No acute distress. HEENT: [place] AT, moist mucus membranes.  Trachea midline Cardiovascular: No clubbing, cyanosis, or edema. Respiratory: Normal respiratory effort, no increased work of breathing. Neurologic: Grossly intact, no focal deficits, moving all 4 extremities. Psychiatric: Normal mood and affect.    Laboratory Data: Urinalysis Results for orders placed or performed in visit on 03/18/23  Microscopic Examination   Urine  Result Value Ref Range   WBC, UA 0-5 0 - 5 /hpf   RBC, Urine 0-2 0 - 2 /hpf    Epithelial Cells (non renal) >10 (A) 0 - 10 /hpf   Crystals Present (A) N/A   Crystal Type Amorphous Sediment N/A   Mucus, UA Present (A) Not Estab.   Bacteria, UA Moderate (A) None seen/Few  Urinalysis, Complete  Result Value Ref Range   Specific Gravity, UA 1.020 1.005 - 1.030   pH, UA 7.0 5.0 - 7.5   Color, UA Yellow Yellow   Appearance Ur Clear Clear   Leukocytes,UA Negative Negative   Protein,UA Negative Negative/Trace   Glucose, UA Negative Negative   Ketones, UA Negative Negative   RBC, UA Negative Negative   Bilirubin, UA Negative Negative   Urobilinogen, Ur 0.2 0.2 - 1.0 mg/dL   Nitrite, UA Negative Negative   Microscopic Examination See below:   Bladder Scan (Post Void Residual) in office  Result Value Ref Range   Scan Result 0ml   I have reviewed the labs.   Pertinent Imaging:  03/18/23 15:04  Scan Result 0ml    Assessment & Plan:   IC  -At goal with oxybutynin XL 10 mg daily -Continue oxybutynin 10 mg XL - BLADDER SCAN AMB NON-IMAGING  Return in about 1 year (around 03/17/2024) for PVR and OAB questionnaire, UA .  [name]   [facility] [address] [place], [place] [address] [phone]

## 2023-03-18 ENCOUNTER — Encounter: Payer: Self-pay | Admitting: Urology

## 2023-03-18 ENCOUNTER — Ambulatory Visit: Payer: Managed Care, Other (non HMO) | Admitting: Urology

## 2023-03-18 VITALS — BP 115/76 | HR 81 | Wt 186.0 lb

## 2023-03-18 DIAGNOSIS — N3010 Interstitial cystitis (chronic) without hematuria: Secondary | ICD-10-CM

## 2023-03-18 LAB — URINALYSIS, COMPLETE
Bilirubin, UA: NEGATIVE
Glucose, UA: NEGATIVE
Ketones, UA: NEGATIVE
Leukocytes,UA: NEGATIVE
Nitrite, UA: NEGATIVE
Protein,UA: NEGATIVE
RBC, UA: NEGATIVE
Specific Gravity, UA: 1.02 (ref 1.005–1.030)
Urobilinogen, Ur: 0.2 mg/dL (ref 0.2–1.0)
pH, UA: 7 (ref 5.0–7.5)

## 2023-03-18 LAB — MICROSCOPIC EXAMINATION: Epithelial Cells (non renal): >10 /hpf — AB (ref 0–10)

## 2023-03-18 LAB — BLADDER SCAN AMB NON-IMAGING

## 2023-03-18 MED ORDER — OXYBUTYNIN CHLORIDE ER 10 MG PO TB24: ORAL_TABLET | ORAL | 3 refills | Status: AC

## 2023-03-23 ENCOUNTER — Ambulatory Visit: Payer: Managed Care, Other (non HMO) | Admitting: Urology

## 2023-11-06 ENCOUNTER — Other Ambulatory Visit: Payer: Self-pay | Admitting: Internal Medicine

## 2023-11-06 DIAGNOSIS — Z1231 Encounter for screening mammogram for malignant neoplasm of breast: Secondary | ICD-10-CM

## 2023-12-05 ENCOUNTER — Emergency Department
Admission: EM | Admit: 2023-12-05 | Discharge: 2023-12-05 | Disposition: A | Discharge status: Home / Self Care | Attending: Emergency Medicine | Admitting: Emergency Medicine

## 2023-12-05 ENCOUNTER — Emergency Department

## 2023-12-05 ENCOUNTER — Other Ambulatory Visit: Payer: Self-pay

## 2023-12-05 DIAGNOSIS — M25512 Pain in left shoulder: Secondary | ICD-10-CM | POA: Insufficient documentation

## 2023-12-05 DIAGNOSIS — R0789 Other chest pain: Secondary | ICD-10-CM | POA: Insufficient documentation

## 2023-12-05 DIAGNOSIS — R42 Dizziness and giddiness: Secondary | ICD-10-CM | POA: Diagnosis not present

## 2023-12-05 DIAGNOSIS — M7918 Myalgia, other site: Secondary | ICD-10-CM

## 2023-12-05 LAB — CBC
HCT: 43.2 % (ref 36.0–46.0)
Hemoglobin: 15.1 g/dL — ABNORMAL HIGH (ref 12.0–15.0)
MCH: 29.7 pg (ref 26.0–34.0)
MCHC: 35 g/dL (ref 30.0–36.0)
MCV: 84.9 fL (ref 80.0–100.0)
Platelets: 229 10*3/uL (ref 150–400)
RBC: 5.09 MIL/uL (ref 3.87–5.11)
RDW: 12 % (ref 11.5–15.5)
WBC: 8.4 10*3/uL (ref 4.0–10.5)
nRBC: 0 % (ref 0.0–0.2)

## 2023-12-05 LAB — BASIC METABOLIC PANEL
Anion gap: 4 — ABNORMAL LOW (ref 5–15)
BUN: 15 mg/dL (ref 6–20)
CO2: 21 mmol/L — ABNORMAL LOW (ref 22–32)
Calcium: 8.8 mg/dL — ABNORMAL LOW (ref 8.9–10.3)
Chloride: 108 mmol/L (ref 98–111)
Creatinine, Ser: 0.92 mg/dL (ref 0.44–1.00)
GFR, Estimated: >60 mL/min (ref >60)
Glucose, Bld: 79 mg/dL (ref 70–99)
Potassium: 4.2 mmol/L (ref 3.5–5.1)
Sodium: 133 mmol/L — ABNORMAL LOW (ref 135–145)

## 2023-12-05 LAB — TROPONIN I (HIGH SENSITIVITY)
Troponin I (High Sensitivity): <2 ng/L (ref <18)
Troponin I (High Sensitivity): 3 ng/L (ref <18)

## 2023-12-05 MED ORDER — ONDANSETRON 4 MG PO TBDP
4.0000 mg | ORAL_TABLET | Freq: Once | ORAL | Status: AC
  Administered 2023-12-05: 4 mg via ORAL
  Filled 2023-12-05: qty 1

## 2023-12-05 MED ORDER — CYCLOBENZAPRINE HCL 5 MG PO TABS
5.0000 mg | ORAL_TABLET | Freq: Three times a day (TID) | ORAL | 0 refills | Status: AC | PRN
Start: 2023-12-05 — End: ?

## 2023-12-05 MED ORDER — IOHEXOL 350 MG/ML SOLN
75.0000 mL | Freq: Once | INTRAVENOUS | Status: AC | PRN
Start: 2023-12-05 — End: 2023-12-05
  Administered 2023-12-05: 75 mL via INTRAVENOUS

## 2023-12-05 MED ORDER — OXYCODONE-ACETAMINOPHEN 5-325 MG PO TABS
1.0000 | ORAL_TABLET | Freq: Once | ORAL | Status: AC
  Administered 2023-12-05: 1 via ORAL
  Filled 2023-12-05: qty 1

## 2023-12-05 NOTE — ED Triage Notes (Signed)
 Brought from [facility]. Left shoulder blade pain with sob, pain with activity, and dizziness when bending over.   KC vitals: 121/76 b/p 85HR 96 O2 97.6oral

## 2023-12-05 NOTE — ED Provider Notes (Signed)
 [facility] Provider Note    Event Date/Time   First MD Initiated Contact with Patient 12/05/23 1141     (approximate)  History   Chief Complaint: Shoulder Pain  HPI  Kendra West is a 48 y.o. female with a past medical history of anxiety, presents to the emergency department for left shoulder pain.  According to the patient over the past 3 to 4 days she has been experiencing a sharp pain in the left shoulder.  Patient states somewhat worse with movement but denies any heavy lifting twisting turning.  Patient states that has been uncomfortable try to sleep on her left side.  Denies any fever.  Denies any shortness of breath or pleuritic pain.  Patient states she has been getting dizzy at times which she states when she first stands up she may get a little lightheaded but then feels better.  Denies any anterior chest pain.    Physical Exam   Triage Vital Signs: ED Triage Vitals  Encounter Vitals Group     BP 12/05/23 1106 (!) 119/54     Systolic BP Percentile --      Diastolic BP Percentile --      Pulse Rate 12/05/23 1106 81     Resp 12/05/23 1106 20     Temp 12/05/23 1106 97.6 F (36.4 C)     Temp Source 12/05/23 1106 Oral     SpO2 12/05/23 1106 98 %     Weight --      Height --      Head Circumference --      Peak Flow --      Pain Score 12/05/23 1107 6     Pain Loc --      Pain Education --      Exclude from Growth Chart --     Most recent vital signs: Vitals:   12/05/23 1106  BP: (!) 119/54  Pulse: 81  Resp: 20  Temp: 97.6 F (36.4 C)  SpO2: 98%    General: Awake, no distress.  CV:  Good peripheral perfusion.  Regular rate and rhythm  Resp:  Normal effort.  Equal breath sounds bilaterally.  No anterior chest tenderness to palpation however the patient does have mild to moderate tenderness around the left shoulder blade.  No midline tenderness. Abd:  No distention.  Soft, nontender.    ED Results / Procedures / Treatments    EKG  EKG viewed and interpreted by myself shows a normal sinus rhythm at 67 bpm with a narrow QRS, normal axis, normal intervals, no concerning ST changes.  RADIOLOGY  I have reviewed interpret the chest x-ray images.  No consolidation seen on my evaluation. Radiology has read the x-ray as negative.   MEDICATIONS ORDERED IN ED: Medications - No data to display   IMPRESSION / MDM / ASSESSMENT AND PLAN / ED COURSE  I reviewed the triage vital signs and the nursing notes.  Patient's presentation is most consistent with acute presentation with potential threat to life or bodily function.  Patient presents emergency department for left shoulder blade pain sharp in nature ongoing for the last 4 days or so.  Worse with movement.  Pain is reproducible to palpation of the shoulder blade.  Patient's lab work is so far reassuring with a normal chemistry, reassuring CBC and reassuringly a negative troponin.  Chest x-ray is clear and EKG shows no concerning findings.  However given the patient's sharp posterior chest pain, dizziness with standing and  no heavy lifting etc. we will obtain a CTA of the chest to rule out PE.  I have reviewed the CTA images no obvious PE seen on my evaluation. Radiology is read the CT scan as negative.  The remainder of the patient's workup is reassuring clued a normal CBC chemistry negative troponin x 2.  Suspect more musculoskeletal pain.  We will discharge with a muscle relaxer.  FINAL CLINICAL IMPRESSION(S) / ED DIAGNOSES   Left shoulder blade pain Left posterior chest pain  Note:  This document was prepared using Dragon voice recognition software and may include unintentional dictation errors.   Minna Antis, MD 12/05/23 [phone]

## 2023-12-05 NOTE — ED Triage Notes (Signed)
 Pt to ED via POV from [facility]. PT reports left shoulder blade pain that has been constant for the last few days. Denies radiation. Pt denies extraneous activity. Pt also reports SOB and dizziness with certain movements.

## 2024-03-22 ENCOUNTER — Ambulatory Visit: Payer: Self-pay | Admitting: Urology

## 2024-05-25 ENCOUNTER — Ambulatory Visit
Admission: RE | Admit: 2024-05-25 | Discharge: 2024-05-25 | Disposition: A | Discharge status: Home / Self Care | Source: Ambulatory Visit | Attending: Internal Medicine | Admitting: Internal Medicine

## 2024-05-25 DIAGNOSIS — Z1231 Encounter for screening mammogram for malignant neoplasm of breast: Secondary | ICD-10-CM | POA: Diagnosis present
